# Patient Record
Sex: Female | Born: 1945 | Race: White | Hispanic: No | Marital: Single | Smoking: Never smoker
Health system: Southern US, Community
[De-identification: ages and names within clinical notes are randomized; demographics above are authoritative.]

## PROBLEM LIST (undated history)

## (undated) DIAGNOSIS — I10 Essential (primary) hypertension: Secondary | ICD-10-CM

---

## 2012-02-23 HISTORY — PX: CHOLECYSTECTOMY: SHX55

## 2012-02-23 HISTORY — PX: OTHER SURGICAL HISTORY: SHX169

## 2012-09-27 ENCOUNTER — Emergency Department: Payer: Self-pay | Admitting: Emergency Medicine

## 2012-09-27 LAB — URINALYSIS, COMPLETE
Glucose,UR: NEGATIVE mg/dL (ref 0–75)
Ketone: NEGATIVE
Nitrite: POSITIVE
Ph: 5 (ref 4.5–8.0)
Protein: NEGATIVE
RBC,UR: 11 /HPF (ref 0–5)
Specific Gravity: 1.015 (ref 1.003–1.030)
Squamous Epithelial: 17
Transitional Epi: 6
WBC UR: 59 /HPF (ref 0–5)

## 2012-09-27 LAB — COMPREHENSIVE METABOLIC PANEL
Albumin: 3.6 g/dL (ref 3.4–5.0)
Alkaline Phosphatase: 154 U/L — ABNORMAL HIGH (ref 50–136)
Anion Gap: 7 (ref 7–16)
BUN: 15 mg/dL (ref 7–18)
Calcium, Total: 9.2 mg/dL (ref 8.5–10.1)
Co2: 29 mmol/L (ref 21–32)
Creatinine: 1.05 mg/dL (ref 0.60–1.30)
EGFR (African American): >60
Glucose: 149 mg/dL — ABNORMAL HIGH (ref 65–99)
Osmolality: 272 (ref 275–301)
SGOT(AST): 306 U/L — ABNORMAL HIGH (ref 15–37)
SGPT (ALT): 303 U/L — ABNORMAL HIGH (ref 12–78)
Total Protein: 7.9 g/dL (ref 6.4–8.2)

## 2012-09-27 LAB — CBC
Platelet: 240 10*3/uL (ref 150–440)
RBC: 4.52 10*6/uL (ref 3.80–5.20)
WBC: 11.4 10*3/uL — ABNORMAL HIGH (ref 3.6–11.0)

## 2012-09-27 LAB — TROPONIN I: Troponin-I: <0.02 ng/mL

## 2012-09-28 ENCOUNTER — Inpatient Hospital Stay: Payer: Self-pay | Admitting: Surgery

## 2012-09-29 LAB — CBC WITH DIFFERENTIAL/PLATELET
Basophil #: 0 10*3/uL (ref 0.0–0.1)
Eosinophil %: 0 %
HCT: 29.7 % — ABNORMAL LOW (ref 35.0–47.0)
Lymphocyte #: 1.1 10*3/uL (ref 1.0–3.6)
MCH: 28.6 pg (ref 26.0–34.0)
Monocyte #: 0.9 x10 3/mm (ref 0.2–0.9)
Monocyte %: 8.2 %
Neutrophil %: 81.2 %
Platelet: 248 10*3/uL (ref 150–440)
RBC: 3.59 10*6/uL — ABNORMAL LOW (ref 3.80–5.20)
RDW: 13.8 % (ref 11.5–14.5)

## 2012-09-29 LAB — HEPATIC FUNCTION PANEL A (ARMC)
Albumin: 2.5 g/dL — ABNORMAL LOW (ref 3.4–5.0)
Alkaline Phosphatase: 132 U/L (ref 50–136)
Bilirubin, Direct: 0.2 mg/dL (ref 0.00–0.20)
Bilirubin,Total: 0.7 mg/dL (ref 0.2–1.0)
SGOT(AST): 155 U/L — ABNORMAL HIGH (ref 15–37)

## 2012-09-29 LAB — BASIC METABOLIC PANEL
Calcium, Total: 8 mg/dL — ABNORMAL LOW (ref 8.5–10.1)
Chloride: 101 mmol/L (ref 98–107)
Creatinine: 1.18 mg/dL (ref 0.60–1.30)
EGFR (African American): 56 — ABNORMAL LOW
Osmolality: 277 (ref 275–301)
Potassium: 4.6 mmol/L (ref 3.5–5.1)
Sodium: 135 mmol/L — ABNORMAL LOW (ref 136–145)

## 2012-09-30 LAB — BASIC METABOLIC PANEL
BUN: 17 mg/dL (ref 7–18)
Calcium, Total: 8.1 mg/dL — ABNORMAL LOW (ref 8.5–10.1)
Chloride: 103 mmol/L (ref 98–107)
Co2: 28 mmol/L (ref 21–32)
EGFR (African American): >60
EGFR (Non-African Amer.): >60
Glucose: 149 mg/dL — ABNORMAL HIGH (ref 65–99)
Osmolality: 273 (ref 275–301)
Potassium: 4.7 mmol/L (ref 3.5–5.1)
Sodium: 134 mmol/L — ABNORMAL LOW (ref 136–145)

## 2012-09-30 LAB — CBC WITH DIFFERENTIAL/PLATELET
Basophil %: 0.2 %
Eosinophil #: 0.1 10*3/uL (ref 0.0–0.7)
Lymphocyte %: 8 %
MCHC: 34.2 g/dL (ref 32.0–36.0)
Monocyte #: 0.5 x10 3/mm (ref 0.2–0.9)
Platelet: 276 10*3/uL (ref 150–440)
RDW: 13.9 % (ref 11.5–14.5)
WBC: 6.3 10*3/uL (ref 3.6–11.0)

## 2012-09-30 LAB — HEPATIC FUNCTION PANEL A (ARMC)
Albumin: 2.2 g/dL — ABNORMAL LOW (ref 3.4–5.0)
Alkaline Phosphatase: 122 U/L (ref 50–136)
SGOT(AST): 70 U/L — ABNORMAL HIGH (ref 15–37)
SGPT (ALT): 95 U/L — ABNORMAL HIGH (ref 12–78)

## 2012-10-01 LAB — HEPATIC FUNCTION PANEL A (ARMC)
Albumin: 2.2 g/dL — ABNORMAL LOW (ref 3.4–5.0)
Alkaline Phosphatase: 166 U/L — ABNORMAL HIGH (ref 50–136)
Bilirubin, Direct: 0.3 mg/dL — ABNORMAL HIGH (ref 0.00–0.20)
SGOT(AST): 48 U/L — ABNORMAL HIGH (ref 15–37)
SGPT (ALT): 71 U/L (ref 12–78)

## 2012-10-01 LAB — BASIC METABOLIC PANEL
Calcium, Total: 8.4 mg/dL — ABNORMAL LOW (ref 8.5–10.1)
Co2: 29 mmol/L (ref 21–32)
Creatinine: 0.79 mg/dL (ref 0.60–1.30)
EGFR (African American): >60
Osmolality: 273 (ref 275–301)

## 2012-10-01 LAB — CBC WITH DIFFERENTIAL/PLATELET
Basophil %: 0.1 %
Eosinophil #: 0.1 10*3/uL (ref 0.0–0.7)
HCT: 27.2 % — ABNORMAL LOW (ref 35.0–47.0)
HGB: 9.3 g/dL — ABNORMAL LOW (ref 12.0–16.0)
Lymphocyte #: 1.2 10*3/uL (ref 1.0–3.6)
Lymphocyte %: 17.1 %
MCHC: 34.3 g/dL (ref 32.0–36.0)
MCV: 83 fL (ref 80–100)
Monocyte #: 0.8 x10 3/mm (ref 0.2–0.9)
Monocyte %: 11.3 %
Neutrophil #: 4.8 10*3/uL (ref 1.4–6.5)
Neutrophil %: 69.4 %
Platelet: 337 10*3/uL (ref 150–440)
RBC: 3.26 10*6/uL — ABNORMAL LOW (ref 3.80–5.20)
RDW: 13.7 % (ref 11.5–14.5)
WBC: 6.9 10*3/uL (ref 3.6–11.0)

## 2012-10-02 LAB — CBC WITH DIFFERENTIAL/PLATELET
Basophil %: 0.3 %
Eosinophil %: 2.2 %
HCT: 27 % — ABNORMAL LOW (ref 35.0–47.0)
HGB: 9.5 g/dL — ABNORMAL LOW (ref 12.0–16.0)
Lymphocyte #: 1.6 10*3/uL (ref 1.0–3.6)
Lymphocyte %: 18.1 %
MCHC: 35.1 g/dL (ref 32.0–36.0)
MCV: 82 fL (ref 80–100)
Monocyte %: 10.6 %
Neutrophil #: 6.1 10*3/uL (ref 1.4–6.5)
Platelet: 433 10*3/uL (ref 150–440)
RBC: 3.31 10*6/uL — ABNORMAL LOW (ref 3.80–5.20)
WBC: 8.9 10*3/uL (ref 3.6–11.0)

## 2012-10-02 LAB — PATHOLOGY REPORT

## 2012-10-16 ENCOUNTER — Ambulatory Visit: Payer: Self-pay

## 2012-10-16 LAB — CBC WITH DIFFERENTIAL/PLATELET
Basophil #: 0.1 10*3/uL (ref 0.0–0.1)
Lymphocyte #: 1.5 10*3/uL (ref 1.0–3.6)
Lymphocyte %: 14.9 %
MCH: 26.8 pg (ref 26.0–34.0)
MCV: 81 fL (ref 80–100)
Neutrophil #: 7.5 10*3/uL — ABNORMAL HIGH (ref 1.4–6.5)
Neutrophil %: 76.3 %
Platelet: 553 10*3/uL — ABNORMAL HIGH (ref 150–440)
RBC: 3.75 10*6/uL — ABNORMAL LOW (ref 3.80–5.20)
RDW: 13.8 % (ref 11.5–14.5)
WBC: 9.8 10*3/uL (ref 3.6–11.0)

## 2012-10-16 LAB — COMPREHENSIVE METABOLIC PANEL
Albumin: 3.3 g/dL — ABNORMAL LOW (ref 3.4–5.0)
Alkaline Phosphatase: 241 U/L — ABNORMAL HIGH (ref 50–136)
BUN: 16 mg/dL (ref 7–18)
Bilirubin,Total: 0.3 mg/dL (ref 0.2–1.0)
Calcium, Total: 10 mg/dL (ref 8.5–10.1)
Chloride: 97 mmol/L — ABNORMAL LOW (ref 98–107)
EGFR (African American): >60
Glucose: 127 mg/dL — ABNORMAL HIGH (ref 65–99)
Osmolality: 278 (ref 275–301)
Potassium: 4.9 mmol/L (ref 3.5–5.1)
SGOT(AST): 42 U/L — ABNORMAL HIGH (ref 15–37)
SGPT (ALT): 51 U/L (ref 12–78)
Total Protein: 9.1 g/dL — ABNORMAL HIGH (ref 6.4–8.2)

## 2012-10-16 LAB — URINALYSIS, COMPLETE
Bilirubin,UR: NEGATIVE
Glucose,UR: NEGATIVE mg/dL (ref 0–75)
Ph: 7 (ref 4.5–8.0)
Specific Gravity: 1.01 (ref 1.003–1.030)

## 2012-10-16 LAB — LIPASE, BLOOD: Lipase: 613 U/L — ABNORMAL HIGH (ref 73–393)

## 2012-10-18 LAB — URINE CULTURE

## 2012-10-19 ENCOUNTER — Ambulatory Visit: Payer: Self-pay | Admitting: Surgery

## 2012-10-28 ENCOUNTER — Emergency Department: Payer: Self-pay | Admitting: Emergency Medicine

## 2012-10-28 LAB — COMPREHENSIVE METABOLIC PANEL
Albumin: 3.7 g/dL (ref 3.4–5.0)
Alkaline Phosphatase: 239 U/L — ABNORMAL HIGH (ref 50–136)
Anion Gap: 6 — ABNORMAL LOW (ref 7–16)
BUN: 9 mg/dL (ref 7–18)
Calcium, Total: 9.3 mg/dL (ref 8.5–10.1)
Chloride: 92 mmol/L — ABNORMAL LOW (ref 98–107)
Co2: 29 mmol/L (ref 21–32)
Creatinine: 0.81 mg/dL (ref 0.60–1.30)
EGFR (Non-African Amer.): >60
Osmolality: 257 (ref 275–301)
Potassium: 3.7 mmol/L (ref 3.5–5.1)
SGPT (ALT): 120 U/L — ABNORMAL HIGH (ref 12–78)
Sodium: 127 mmol/L — ABNORMAL LOW (ref 136–145)
Total Protein: 8.7 g/dL — ABNORMAL HIGH (ref 6.4–8.2)

## 2012-10-28 LAB — BASIC METABOLIC PANEL
Chloride: 101 mmol/L (ref 98–107)
Creatinine: 0.67 mg/dL (ref 0.60–1.30)
EGFR (African American): >60
EGFR (Non-African Amer.): >60
Glucose: 114 mg/dL — ABNORMAL HIGH (ref 65–99)
Osmolality: 267 (ref 275–301)
Sodium: 134 mmol/L — ABNORMAL LOW (ref 136–145)

## 2012-10-28 LAB — CBC
HCT: 32.9 % — ABNORMAL LOW (ref 35.0–47.0)
MCHC: 34.3 g/dL (ref 32.0–36.0)
Platelet: 400 10*3/uL (ref 150–440)
RDW: 14 % (ref 11.5–14.5)

## 2012-10-28 LAB — LIPASE, BLOOD: Lipase: 238 U/L (ref 73–393)

## 2012-10-28 LAB — CK TOTAL AND CKMB (NOT AT ARMC): CK-MB: <0.5 ng/mL — ABNORMAL LOW (ref 0.5–3.6)

## 2012-10-30 LAB — CBC WITH DIFFERENTIAL/PLATELET
Basophil #: 0.1 x10 3/mm 3
Basophil %: 1.1 %
Eosinophil #: 0 x10 3/mm 3
Eosinophil %: 0.7 %
HCT: 33 % — ABNORMAL LOW
HGB: 11.4 g/dL — ABNORMAL LOW
Lymphocyte %: 22.9 %
Lymphs Abs: 1.6 x10 3/mm 3
MCH: 27.5 pg
MCHC: 34.4 g/dL
MCV: 80 fL
Monocyte #: 0.6 "x10 3/mm "
Monocyte %: 8.6 %
Neutrophil #: 4.6 x10 3/mm 3
Neutrophil %: 66.7 %
Platelet: 430 x10 3/mm 3
RBC: 4.13 X10 6/mm 3
RDW: 14.4 %
WBC: 6.8 x10 3/mm 3

## 2012-10-30 LAB — COMPREHENSIVE METABOLIC PANEL WITH GFR
Albumin: 3.8 g/dL
Alkaline Phosphatase: 411 U/L — ABNORMAL HIGH
Anion Gap: 6 — ABNORMAL LOW
BUN: 7 mg/dL
Bilirubin,Total: 0.3 mg/dL
Calcium, Total: 9.6 mg/dL
Chloride: 97 mmol/L — ABNORMAL LOW
Co2: 29 mmol/L
Creatinine: 0.85 mg/dL
EGFR (African American): >60
EGFR (Non-African Amer.): >60
Glucose: 118 mg/dL — ABNORMAL HIGH
Osmolality: 264
Potassium: 4 mmol/L
SGOT(AST): 328 U/L — ABNORMAL HIGH
SGPT (ALT): 322 U/L — ABNORMAL HIGH
Sodium: 132 mmol/L — ABNORMAL LOW
Total Protein: 8.5 g/dL — ABNORMAL HIGH

## 2012-10-30 LAB — LIPASE, BLOOD: Lipase: 375 U/L

## 2012-10-31 ENCOUNTER — Inpatient Hospital Stay: Payer: Self-pay | Admitting: Surgery

## 2012-11-02 LAB — COMPREHENSIVE METABOLIC PANEL
Albumin: 3 g/dL — ABNORMAL LOW (ref 3.4–5.0)
BUN: 12 mg/dL (ref 7–18)
Glucose: 88 mg/dL (ref 65–99)
Osmolality: 277 (ref 275–301)
Potassium: 3.7 mmol/L (ref 3.5–5.1)
SGPT (ALT): 128 U/L — ABNORMAL HIGH (ref 12–78)

## 2012-11-22 ENCOUNTER — Emergency Department: Payer: Self-pay | Admitting: Emergency Medicine

## 2012-11-22 LAB — COMPREHENSIVE METABOLIC PANEL
Alkaline Phosphatase: 123 U/L (ref 50–136)
Chloride: 101 mmol/L (ref 98–107)
Creatinine: 0.78 mg/dL (ref 0.60–1.30)
EGFR (Non-African Amer.): >60
Glucose: 111 mg/dL — ABNORMAL HIGH (ref 65–99)
Osmolality: 273 (ref 275–301)
Potassium: 3.3 mmol/L — ABNORMAL LOW (ref 3.5–5.1)
SGOT(AST): 23 U/L (ref 15–37)
Sodium: 136 mmol/L (ref 136–145)
Total Protein: 7.9 g/dL (ref 6.4–8.2)

## 2012-11-22 LAB — CBC WITH DIFFERENTIAL/PLATELET
Basophil #: 0 10*3/uL (ref 0.0–0.1)
Lymphocyte %: 33 %
MCH: 27.6 pg (ref 26.0–34.0)
Monocyte %: 5.5 %
Neutrophil #: 3.9 10*3/uL (ref 1.4–6.5)
Neutrophil %: 59.7 %
Platelet: 348 10*3/uL (ref 150–440)
RBC: 3.99 10*6/uL (ref 3.80–5.20)
RDW: 14.2 % (ref 11.5–14.5)

## 2012-11-22 LAB — TROPONIN I: Troponin-I: <0.02 ng/mL

## 2014-06-14 NOTE — Consult Note (Signed)
Asked to see patient for poss ERCP. Fortunately, daughters present at bedside. Discussed ERCP with daughters/pt. Discussed potential risks, incl pancreatitis.. Min RUQ pain now. LFT high. Pt NPO today. Will try to get ERCP later this afternoon. Thanks.  Electronic Signatures: Lutricia Feilh, Floyce Bujak (MD)  (Signed on 10-Sep-14 13:23)  Authored  Last Updated: 10-Sep-14 13:23 by Lutricia Feilh, Allyiah Gartner (MD)

## 2014-06-14 NOTE — Discharge Summary (Signed)
PATIENT NAME:  Rebekah DandyBAFTIU, Athalee MR#:  [id number] DATE OF BIRTH:  Nov 14, 1945  DATE OF ADMISSION:  09/28/2012  DATE OF DISCHARGE:  10/03/2012  BRIEF HISTORY:  [name] is a 69 year old [place] woman, seen in the Emergency Room with severe biliary symptoms, with probable acute cholecystitis. The patient had been sick for several weeks, and symptoms had increased over the several days prior to admission. She was seen on August 6. We felt that she would be best served with surgical intervention, as she did appear to have acute cholecystitis. She has slightly elevated bilirubin 1.2, and white blood cell count 11,000. Ultrasound revealed multiple stones, thickened gallbladder wall, evidence of acute cholecystitis. We recommended that she be admitted, treated with antibiotics, prepared for surgery. She declined, but returned the following morning for semi-elective surgical intervention. She underwent laparoscopic cholecystectomy with conversion to open cholecystectomy because of the severity of her gallbladder disease. She did appear to have a perforated gallbladder with necrosis in the dome of the gallbladder. The procedure had to be converted to an open procedure. She did have a very large common bile duct, but no evidence by liver function studies of obstruction. We cannot do a cholangiogram through the completely occluded cystic duct. Drains were placed, and the patient recovered uneventfully. She did have some mild postop bacteremia, likely secondary to profound abdominal infection. She continued to improve over the next several days, discharged home on the 12th, to be followed in the office in 7 to 10 days' time. The drain was removed.   DISCHARGE MEDICATIONS:  Include hydrochlorothiazide 12.5 mg once a day, aspirin 81 mg once a day, Zofran 4 mg t.i.d. p.r.n., and Vicodin 325/5 mg every 4 hours p.r.n.   FOLLOW UP:  She is set up to be followed in the office for possible investigation of her dilated  common bile duct with ERCP. The patient and her family were in agreement.      ____________________________ Carmie Endalph L. Ely III, MD rle:mr D: 10/18/2012 14:48:59 ET T: 10/18/2012 19:35:34 ET JOB#: [id number]  cc: Quentin Orealph L. Ely III, MD, <Dictator> Quentin OreALPH L ELY MD ELECTRONICALLY SIGNED 10/21/2012 0:17

## 2014-06-14 NOTE — Op Note (Signed)
PATIENT NAME:  Rebekah Oliver, Rebekah Oliver MR#:  [id number] DATE OF BIRTH:  1945-07-22  DATE OF PROCEDURE:  09/28/2012  PREOPERATIVE DIAGNOSIS: Acute cholecystitis.   POSTOPERATIVE DIAGNOSIS: Acute cholecystitis.   SURGERY: Laparoscopic cholecystectomy, conversion to open cholecystectomy.   SURGEON: Carmie Endalph L. Ely III, M.D.   ASSISTANElaina Pattee: [place], [place] student, and [place] student.   ANESTHESIA: General.   OPERATIVE PROCEDURE: With the patient in the supine position after induction of appropriate general anesthesia, the patient's abdomen was prepped with ChloraPrep and draped with sterile towels. The patient was placed in the head down, feet up position. A small infraumbilical incision was made in the standard fashion, carried down bluntly through the subcutaneous tissue. A Veress needle was used to cannulate the peritoneal cavity. CO2 was insufflated to appropriate pressure measurements. When approximately 2.5 liters of CO2 was instilled, the Veress needle was withdrawn. An 11 mm [employer] port was inserted into the peritoneal cavity. Intraperitoneal position was confirmed. CO2 was reinsufflated. The patient was placed in head up, feet down position and rotated slightly to the left side. Subxiphoid transverse incision was made and an 11 mm port inserted under direct vision. Two lateral ports, 5 mm in size, were inserted under direct vision. The stomach was moderately distended, so an orogastric tube was placed to decompress the stomach. The stomach was stuck to the omentum which was stuck over the gallbladder. The dome of the gallbladder was necrotic, and a large amount of purulence was noted, suggesting a ruptured gallbladder. The omentum was taken down with tedious dissection to get to the gallbladder. Hemostasis was achieved with Bovie electrocautery. Multiple attempts were made to establish the anatomy, and it appeared that the gallbladder ended just below the duodenum with the common bile duct dilated  and extending up into the porta hepatis. Because the dissection was so tedious and I was concerned about the anatomy, I elected to proceed with an open procedure. The abdomen was desufflated, and a right subcostal incision was made in the standard fashion. The incision was carried down through the subcutaneous tissue with Bovie electrocautery. Anterior and posterior fascia was identified and opened individually. The rectus muscle was divided with the Bovie. Posterior sheath was opened into the peritoneum. The Omni-Tract retractor was utilized for exposure. The gallbladder was then taken down from the dome toward the body of the gallbladder and the duct. There did appear to be a significantly distended common bile duct. The cystic duct had a large ductal obstruction at the junction. The cystic artery and cystic duct were identified. There was some bleeding identified from the right hepatic artery which was controlled with the Bovie, and the main body of the right hepatic artery was not compromised. The gallbladder was eventually removed from the bed in the liver using a combination of blunt and Bovie dissection. The gallbladder was passed off the table. The cystic duct was still quite distended and had not been controlled significantly because of the large obstructing stone right at the junction of the common duct. The duct was opened down to the edge of the common duct and the stone extracted. The cholangiogram catheter could not be inserted through the multiple valves into the common bile duct. I elected not to proceed with a common duct exploration as she did not have a significantly elevated bilirubin. However, her common bile duct was approximately 15 mm in diameter. If her bilirubin goes up or her clinical course suggests it, we will move to ERCP. For that reason, the cystic  duct stump was oversewn with individual sutures of 3-0 silk. There was a small duct in the bed of the liver which appeared to be an  accessory duct which was leaking a tiny amount of bile. It was oversewn using 3-0 Vicryl. Avitene was placed in the bed of the liver. The area was copiously irrigated and no significant bleeding sites were encountered. A piece of Gelfoam was then brought to the table, moistened and placed in the bed of the liver. A 19-French Jackson-Pratt drain was inserted through a separate stab wound into the bed of the liver. The drain was secured with 3-0 nylon. The fascia was closed in 2 layers with running 0 Maxon in the posterior layer and interrupted figure-of-eight sutures in the anterior fascia. Skin was clipped. Sterile dressings were applied. The patient was returned to the recovery room, having tolerated the procedure well. Sponge, instrument and needle counts were correct x2 in the operating room.    ____________________________ Quentin Ore III, MD rle:gb D: 09/28/2012 16:33:27 ET T: 09/29/2012 00:19:44 ET JOB#: [id number]  cc: Quentin Ore III, MD, <Dictator> Quentin Ore MD ELECTRONICALLY SIGNED 09/29/2012 16:58

## 2014-06-14 NOTE — Consult Note (Signed)
Chief Complaint:  Subjective/Chief Complaint seen for ruq pain, feeling some better today, denies nausea, abd pin improved.   VITAL SIGNS/ANCILLARY NOTES: **Vital Signs.:   09-Sep-14 14:33  Vital Signs Type Routine  Temperature Temperature (F) 98.6  Celsius 37  Temperature Source oral  Pulse Pulse 82  Respirations Respirations 18  Systolic BP Systolic BP 143  Diastolic BP (mmHg) Diastolic BP (mmHg) 78  Mean BP 99  Pulse Ox % Pulse Ox % 94  Pulse Ox Activity Level  At rest  Oxygen Delivery Room Air/ 21 %   Brief Assessment:  Cardiac Regular   Respiratory clear BS   Gastrointestinal details normal Soft  Nondistended  Bowel sounds normal  No rebound tenderness  mild discomfort ruq   Radiology Results: MRI:    09-Sep-14 14:14, MRCP MR Cholangiogram  MRCP MR Cholangiogram   REASON FOR EXAM:    Bile duct enlargement  COMMENTS:       PROCEDURE: MR  - MR CHOLANGIOGRAM  - Oct 31 2012  2:14PM     RESULT: History: Bile duct enlargement.    Comparison Study: CT 10/28/2012.    Findings: Standard M.R.C.P. obtained. Liver normal. Spleen normal.   Pancreas normal. Signal abnormalities noted distal common bile duct   consistent with gallstones within the distal common bile duct. There is   mild distention of the common bile duct to approxi- 7.6 mm. The common   hepatic bile duct is dilated to 11.4 mm. There is moderate intra- hepatic   ductal dilatation. A stricture at the level of the  cystic duct/common     hepatic duct level cannot be excluded. This could be related to spasm.   Tumor would be less likely.    IMPRESSION:   1. Distal common bile duct stones.  2. Possible stricture versus spasm at the level of the cystic duct/common   hepatic duct level.        Verified By: Gwynn Burly, M.D., MD  Nuclear Med:    09-Sep-14 12:25, Hepatobiliary Image - Nuc Med  Hepatobiliary Image - Nuc Med   REASON FOR EXAM:    Bile leak, obstruction  COMMENTS:       PROCEDURE: NM   - NM HEPATOBILIARY IMAGE  - Oct 31 2012 12:25PM     RESULT: The patient is undergoing evaluation for possible bile leak or   obstruction of the biliary tree. The patient received 8.2 mCi of   technetium 33m labeled Choletec intravenously. Imaging was then performed   over a period of 70  minutes in the supine position.    There is adequate uptake of the radiopharmaceutical by the liver.   Activity is demonstrated within the common bile duct. The duct appears   dilated. The radiotracer does enter the bowel. No extraluminal   radiotracer is demonstrated.  IMPRESSION:   1. Findings do not suggest a bile duct leak.  2. There is mild dilation of the common bile duct.     Dictation Site: 2        Verified By: DAVID A. [place], M.D., MD   Assessment/Plan:  Assessment/Plan:  Assessment 1) ruq pain s/p OCCY following severe cholecystitis-hida negative for bile duct leaks.  MRCP consistant with choledocholithiasis.  -symptomatically improved/   Plan 1) recommend ERCP.  I will contact Dr Bluford Kaufmann tomorrow am for proceedure in PM.  If Dr Servando Snare is available tomorrow, would be happy to have him do case as well, but he may not be available  tomorrow. Continue current.   Electronic Signatures: Barnetta ChapelSkulskie, Martin (MD)  (Signed 09-Sep-14 16:57)  Authored: Chief Complaint, VITAL SIGNS/ANCILLARY NOTES, Brief Assessment, Radiology Results, Assessment/Plan   Last Updated: 09-Sep-14 16:57 by Barnetta ChapelSkulskie, Martin (MD)

## 2014-06-14 NOTE — Consult Note (Signed)
PATIENT NAME:  Rebekah Oliver, Rebekah Oliver MR#:  [id number] DATE OF BIRTH:  09-28-1945  DATE OF CONSULTATION:  09/27/2012  CONSULTING PHYSICIAN:  Rodena Goldmann III, MD PRIMARY CARE PHYSICIAN: [place]  CHIEF COMPLAINT: Abdominal pain, nausea and vomiting.   BRIEF HISTORY: Rebekah Oliver is a 69 year old [place] woman accompanied by her daughter with a month's history of nausea, vomiting, multiple episodes of upper abdominal discomfort radiating through to her back and shoulders. The pain worsened over the last 24 hours. Pain appears to be more significant with eating, and she has lost some weight over the last month. She has significant bloating and indigestion in addition. She came to the Emergency Room for further evaluation. She denies history of hepatitis, yellow jaundice, pancreatitis, peptic ulcer disease, previous diagnosis of gallbladder disease or diverticulitis. She has had no abdominal surgery. Her only hospitalization has been for 6 vaginal deliveries. She denies any cardiac disease, hypertension or diabetes.   MEDICATIONS: She takes no cardioactive or antihypertensive medications.   SOCIAL HISTORY: She does not smoke cigarettes. Lives with her daughter.   REVIEW OF SYSTEMS: Otherwise unremarkable. A 10-point review of systems was carried out. She has no significant problems.   PHYSICAL EXAMINATION: GENERAL: She is an alert pleasant woman. The interview is carried out through the patient's daughter, who does speak [place].  VITALS: Blood pressure 160/77. She is afebrile at 98.3. Heart rate is 90 and regular. Respiratory rate is 18 and regular.  HEENT: No scleral icterus. No pupillary abnormalities. No facial deformities.  NECK: Supple without adenopathy. Midline trachea. No tenderness.  CHEST: Clear with normal pulmonary excursion. No adventitious sounds.  CARDIAC: No murmurs or gallops.  She seems to be in normal sinus rhythm.  ABDOMEN: Her abdomen is generally soft with some mild mid  epigastric discomfort but really no point tenderness, no rebound, no guarding, no masses.  BACK: She has no significant back pain.   EXTREMITIES: Lower extremity exam reveals full range of motion, no deformities and good distal pulses.  PSYCHIATRIC: Exam was normal orientation, normal affect.   LABORATORY AND DIAGNOSTIC DATA:  Laboratory values reveal a white blood cell count of 11,400. Liver functions studies were elevated with a bilirubin of 1.2, an alk phos of 154, elevated transaminases at over 300. Lipase is normal.  Ultrasound reveals multiple stones, mildly thickened gallbladder wall, some signs consistent with acute cholecystitis.   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 I spoke with her about the possibility of surgical intervention. We had planned admission, antibiotic therapy, rehydration and surgery.  At the present time, she is a very concerned about her grandchildren and wants to go home and return for followup and possible surgery tomorrow. At the present time, we are setting her up for surgical intervention tomorrow with the idea of pursuing a laparoscopic cholecystectomy with cholangiography. This plan has been discussed with the patient and her daughter in detail, and they are in agreement. Currently, she is to return to the hospital tomorrow morning for surgical intervention.   ____________________________ Micheline Maze, MD rle:cb D: 09/27/2012 15:19:25 ET T: 09/27/2012 16:36:18 ET JOB#: [id number]  cc: Micheline Maze, MD, <Dictator> [place] MD ELECTRONICALLY SIGNED 09/29/2012 16:57

## 2014-06-14 NOTE — Discharge Summary (Signed)
PATIENT NAME:  Rebekah DandyBAFTIU, Angelise MR#:  [id number] DATE OF BIRTH:  07/19/1945  DATE OF ADMISSION:  10/31/2012 DATE OF DISCHARGE:  11/03/2012  FINAL DIAGNOSIS: Choledocholithiasis, status post open cholecystectomy.   HOSPITAL COURSE SUMMARY:  The patient was admitted with abdominal pain approximately one month status post open cholecystectomy by Dr. Michela PitcherEly on September the 8th. She was found to have white count of 6.8 and fever. A  HIDA scan demonstrates no evidence of bile leak. An M.R.C.P. was performed, demonstrating common bile duct stone seen distally. ERCP was subsequently performed by Dr. Bluford Kaufmannh on the 10th of September, at which point, stones were extracted with biliary sphincterotomy and balloon extraction. Postoperatively, the patient did well. She was advanced to a clear liquid diet. On postprocedural day #1, the patient was doing well, tolerating liquids. On postoperative day #2, from the ERCP, she was stable, tolerating a regular diet with no abdominal pain.    DISCHARGE MEDICATIONS: Hydrochlorothiazide 12.5 mg by mouth once a day, aspirin 81 mg by mouth once a day, Zofran 4 mg by mouth 3 times a day as needed for nausea, clindamycin 300 mg by mouth 1 cap orally every 8 hours for 5 days, Keflex 500 mg by mouth every 6 hours x 5 days and Norco 5/325 1 tab every 6 to 8 hours as needed for pain.  DISCHARGE ACTIVITY:  As tolerated.  DISCHARGE FOLLOWUP:  Follow up in our office in 2 weeks' time. Call with any questions or concerns.     ____________________________ Redge GainerMark A. Egbert GaribaldiBird, MD mab:nts D: 11/04/2012 17:45:15 ET T: 11/05/2012 01:33:07 ET JOB#: [id number]  cc: Loraine LericheMark A. Egbert GaribaldiBird, MD, <Dictator> Ezzard StandingPaul Y. Bluford Kaufmannh, MD Christena DeemMartin U. Skulskie, MD Shakthi Scipio Kela MillinA Jesiah Grismer MD ELECTRONICALLY SIGNED 11/05/2012 17:52

## 2014-06-14 NOTE — Consult Note (Signed)
Distal CBD stone present during ERCP. Biliary sphincterotomy and stone extracted through balloon sweep. Recheck LFT in AM. Clear liquid diet rest of today. Hold tomorrow's heparin dose. Thanks  Electronic Signatures: Lutricia Feilh, Mazikeen Hehn (MD)  (Signed on 10-Sep-14 16:18)  Authored  Last Updated: 10-Sep-14 16:18 by Lutricia Feilh, Nakeyia Menden (MD)

## 2014-06-14 NOTE — Consult Note (Signed)
Brief Consult Note: Diagnosis: right upper quadrant pain.   Patient was seen by consultant.   Recommend further assessment or treatment.   Discussed with Attending MD.   Comments: Please see full GI consult to follow.  Patient seen and examined, chart reviewed.  Patient presenting with increasing ruq abdominal pain over the past 2 weeks of a burning nature.  Patient had OCCY 09/28/12 for apparent perforated gallbladder.  Pre surgical pain better after surgery, but then began to have ruq burning since then , increasing as above.  Mild nausea no emesis.  poor appertite.  CBD dilated, but less so than on previous ct several weeks ago.  Concern for late/low grade bile duct leak, with symptomatic mild to moderate bile peritonitis.  Recommend hepatobiliary scan initially and if negative the MRCP.  If the HIDA is postive for duct leak, ERCP.  Discussed with Dr Michela PitcherEly.  Electronic Signatures for Addendum Section:  Barnetta ChapelSkulskie, Adalynn Corne (MD) (Signed Addendum 08-Sep-14 20:53)  Please see full GI consult [id number].   Electronic Signatures: Barnetta ChapelSkulskie, Ramzi Brathwaite (MD)  (Signed 08-Sep-14 20:38)  Authored: Brief Consult Note   Last Updated: 08-Sep-14 20:53 by Barnetta ChapelSkulskie, Alphonso Gregson (MD)

## 2014-06-14 NOTE — Consult Note (Signed)
Chief Complaint:  Subjective/Chief Complaint s/p ercp for choledocholithiasis s/p occy. doing well tolerating clears, denies abdominal pain or nausea.   VITAL SIGNS/ANCILLARY NOTES: **Vital Signs.:   11-Sep-14 14:19  Vital Signs Type Routine  Temperature Temperature (F) 98.4  Celsius 36.8  Temperature Source oral  Pulse Pulse 70  Respirations Respirations 18  Systolic BP Systolic BP 505  Diastolic BP (mmHg) Diastolic BP (mmHg) 75  Mean BP 88  Pulse Ox % Pulse Ox % 96  Pulse Ox Activity Level  At rest  Oxygen Delivery Room Air/ 21 %   Brief Assessment:  Cardiac Regular   Respiratory clear BS   Gastrointestinal details normal Soft  Nontender  Nondistended  No masses palpable  Bowel sounds normal   Lab Results: Hepatic:  08-Sep-14 15:00   Bilirubin, Total 0.3  Alkaline Phosphatase  411  SGPT (ALT)  322  SGOT (AST)  328  Total Protein, Serum  8.5  Albumin, Serum 3.8  11-Sep-14 04:58   Bilirubin, Total 0.8  Alkaline Phosphatase  275  SGPT (ALT)  128  Total Protein, Serum 6.8  Albumin, Serum  3.0  Routine Chem:  08-Sep-14 15:00   Glucose, Serum  118  BUN 7  Creatinine (comp) 0.85  Sodium, Serum  132  Potassium, Serum 4.0  Chloride, Serum  97  CO2, Serum 29  Calcium (Total), Serum 9.6  Osmolality (calc) 264  eGFR (African American) >60  eGFR (Non-African American) >60 (eGFR values <82m/min/1.73 m2 may be an indication of chronic kidney disease (CKD). Calculated eGFR is useful in patients with stable renal function. The eGFR calculation will not be reliable in acutely ill patients when serum creatinine is changing rapidly. It is not useful in  patients on dialysis. The eGFR calculation may not be applicable to patients at the low and high extremes of body sizes, pregnant women, and vegetarians.)  Anion Gap  6  Lipase 375 (Result(s) reported on 30 Oct 2012 at 03:33PM.)  11-Sep-14 04:58   Glucose, Serum 88  BUN 12  Creatinine (comp) 1.02  Sodium, Serum 139   Potassium, Serum 3.7  Chloride, Serum 105  CO2, Serum 27  Calcium (Total), Serum 8.9  Osmolality (calc) 277  eGFR (African American) >60  eGFR (Non-African American)  57 (eGFR values <657mmin/1.73 m2 may be an indication of chronic kidney disease (CKD). Calculated eGFR is useful in patients with stable renal function. The eGFR calculation will not be reliable in acutely ill patients when serum creatinine is changing rapidly. It is not useful in  patients on dialysis. The eGFR calculation may not be applicable to patients at the low and high extremes of body sizes, pregnant women, and vegetarians.)  Anion Gap 7  Routine Hem:  08-Sep-14 15:00   WBC (CBC) 6.8  RBC (CBC) 4.13  Hemoglobin (CBC)  11.4  Hematocrit (CBC)  33.0  Platelet Count (CBC) 430  MCV 80  MCH 27.5  MCHC 34.4  RDW 14.4  Neutrophil % 66.7  Lymphocyte % 22.9  Monocyte % 8.6  Eosinophil % 0.7  Basophil % 1.1  Neutrophil # 4.6  Lymphocyte # 1.6  Monocyte # 0.6  Eosinophil # 0.0  Basophil # 0.1 (Result(s) reported on 30 Oct 2012 at 03:23PM.)   Assessment/Plan:  Assessment/Plan:  Assessment 1) choledocholithiasis s/p ercp, doing well, tolerating clears. will sign off reconsult as needed.   Electronic Signatures: SkLoistine SimasMD)  (Signed 11-Sep-14 17:47)  Authored: Chief Complaint, VITAL SIGNS/ANCILLARY NOTES, Brief Assessment, Lab Results, Assessment/Plan   Last  Updated: 11-Sep-14 17:47 by Loistine Simas (MD)

## 2014-06-14 NOTE — Consult Note (Signed)
PATIENT NAME:  Rebekah Oliver, Rebekah Oliver MR#:  [id number] DATE OF BIRTH:  10-Jun-1945  DATE OF CONSULTATION:  10/30/2012  REFERRING PHYSICIAN:  Dr. Pat Patrick. CONSULTING PHYSICIAN:  Lollie Sails, MD  REASON FOR CONSULTATION: Enlarged bile duct status post cholecystectomy.   HISTORY OF PRESENT ILLNESS: [name] is a 69 year old [place] lady who was admitted to the hospital earlier today by her general surgeon in regard to abdominal pain and some nausea. I attempted to use the translation line; however, the dialect spoken by this patient was not recognized by the translator on that line. Fortunately she had several of her daughters present, one of whom spoke [place] well, and we were able to use this for translation given the lack of other sources.   The patient was admitted with a right upper quadrant pain that has been burning in nature. It does decrease with the use of hydrocodone. It has been increasing over the period of the past 2 weeks with associated nausea without vomiting. It will radiate sometimes around toward the right side more so. The patient underwent a cholecystectomy on 09/28/2012 due to cholecystitis and cholelithiasis. Apparently the patient had been advised to come in to have the procedure and decided to wait for an extra day.   Subsequent procedure was attempted initially laparoscopically then converted to open procedure due to the advanced state of inflammation and infection of the gallbladder. There was likely a perforation of the gallbladder that had occurred. She states that the pain that she had prior to the gallbladder procedure had resolved or gotten better, however, had been replaced with a low-grade burning pain after the procedure.   Over the period of the next several weeks, this pain continued and then increased over the period of the past 2 weeks to where she sought reassessment and was admitted to the hospital by her surgeon.   She did have a CT scan today that showed a gas  density in the gallbladder fossa as well as a large amount of stool in the colon. There was a mild increase of size of the common bile duct; however, in comparison with previous CT scan as well as previous reported ultrasound, this was actually smaller than noted before.   Currently she is hemodynamically stable. She is afebrile.   PAST MEDICAL HISTORY: History of hypertension. There is no apparent other surgical history. She has at times taken medication for reflux, however is not currently taking this at home.   GENITOURINARY FAMILY HISTORY: Not obtainable currently.   REVIEW OF SYSTEMS: The patient had had pain for at least several months prior to her surgery, which improved after the surgery, however now with a different presenting pain.   ALLERGIES: No known drug allergies.   OUTPATIENT MEDICATIONS: Include acetaminophen/hydrocodone 325/5 mg one every six  to 8 hours p.r.n., aspirin 81 mg daily, cephalexin 500 mg one capsule 4 times a day, clindamycin 300 mg every 8 hours, hydrochlorothiazide 12.5 mg once a day, and ondansetron 4 mg three  times a day p.r.n. for nausea or vomiting.   PHYSICAL EXAMINATION:  VITAL SIGNS: Temperature 98.5, pulse 88, respirations 19, blood pressure 128/79, pulse oximetry 97%.  GENERAL: An otherwise well-appearing 69 year old female, no acute distress.  HEENT: Normocephalic, atraumatic.  EYES: Anicteric.  NOSE: Septum midline. No lesions.  OROPHARYNX: No lesions.  NECK: Supple. No JVD.  HEART: Regular rate and rhythm.  LUNGS: Clear.  ABDOMEN: Soft. Markedly tender to palpation in the right upper quadrant. She does have a well-healed surgical  incision in this area. There is no rebound. There are no masses noted although palpation was light. Bowel sounds are positive.  EXTREMITIES: No clubbing, cyanosis, or edema.  NEUROLOGICAL: Cranial nerves II through XII grossly intact. Muscle strength bilaterally equal and symmetric. DTRs bilaterally equal and symmetric.    LABORATORY, DIAGNOSTIC, AND RADIOLOGICAL TODAY: Includes: She had a glucose of 118. BUN 7, creatinine 0.85, sodium 132, potassium 4.0, chloride 97, bicarb 29, calcium 9.6, lipase 375. This on a scale of 73 to 393. This does represent an increase from 238 measured via lab 2 days ago.   Also, 2 days ago, she had a hepatic profile showing a total protein of 8.7, albumin 3.7, total bilirubin 0.3, alk phos 239, AST 65, ALT 120. Repeat hepatic profile today showed the total protein 8.5. Her total bilirubin again, 0.3. Alk phos increased at 411, AST increased at 328, ALT increased at 322.   Her hemogram shows a white count today of 6.8, H and H 11.4/33.80, platelet count 430. MCV 80.   IMAGING: As noted above. She also had a PA and lateral chest film showing no acute cardiopulmonary disease.   ASSESSMENT: Recurrent right upper quadrant pain status post open cholecystectomy. The gallbladder was quite inflamed, likely perforated on removal. On repeat CT scan, her common bile duct is actually somewhat smaller than previous; however, it is still is slightly dilated and there is a fluid level/air pocket in the gallbladder fossa. My concern is for a bile duct leak, either from the bed of the gallbladder, duct of Luschka, or the cystic stump itself. I feel that it is more likely one of the first two since this has been a slow build-up to this point with discomfort.   RECOMMENDATION:  1.  Continue a proton pump inhibitor. The 40 mg oral tablet daily twice a day will be good. Would continue IV antibiotics as you are.  2.  Recommend hepatobiliary study to evaluate for possible bile duct leak. If this is showing a bile duct leak, would proceed to ERCP for further evaluation and possible stent placement depending on the findings at that time. If, however, there is no evidence of bile duct leak on the hepatobiliary study, would proceed with MRCP.  3.  We will follow with you. Thank you for this consult.     ____________________________ Lollie Sails, MD mus:np D: 10/30/2012 20:52:34 ET T: 10/30/2012 21:27:52 ET JOB#: [id number]  cc: Lollie Sails, MD, <Dictator> Micheline Maze, MD Lollie Sails MD ELECTRONICALLY SIGNED 11/02/2012 20:24

## 2015-02-03 ENCOUNTER — Encounter: Payer: Self-pay | Admitting: Internal Medicine

## 2015-02-03 DIAGNOSIS — I10 Essential (primary) hypertension: Secondary | ICD-10-CM | POA: Insufficient documentation

## 2015-05-03 IMAGING — CR DG CHEST 2V
1 series · 2 of 2 positions shown · non-contrast
Comparison: none

REASON FOR EXAM: burning in chest
COMMENTS:

[Series 1: w chest pa · 0.14mm/px · 2 of 2 slices shown]
[im 1/2]
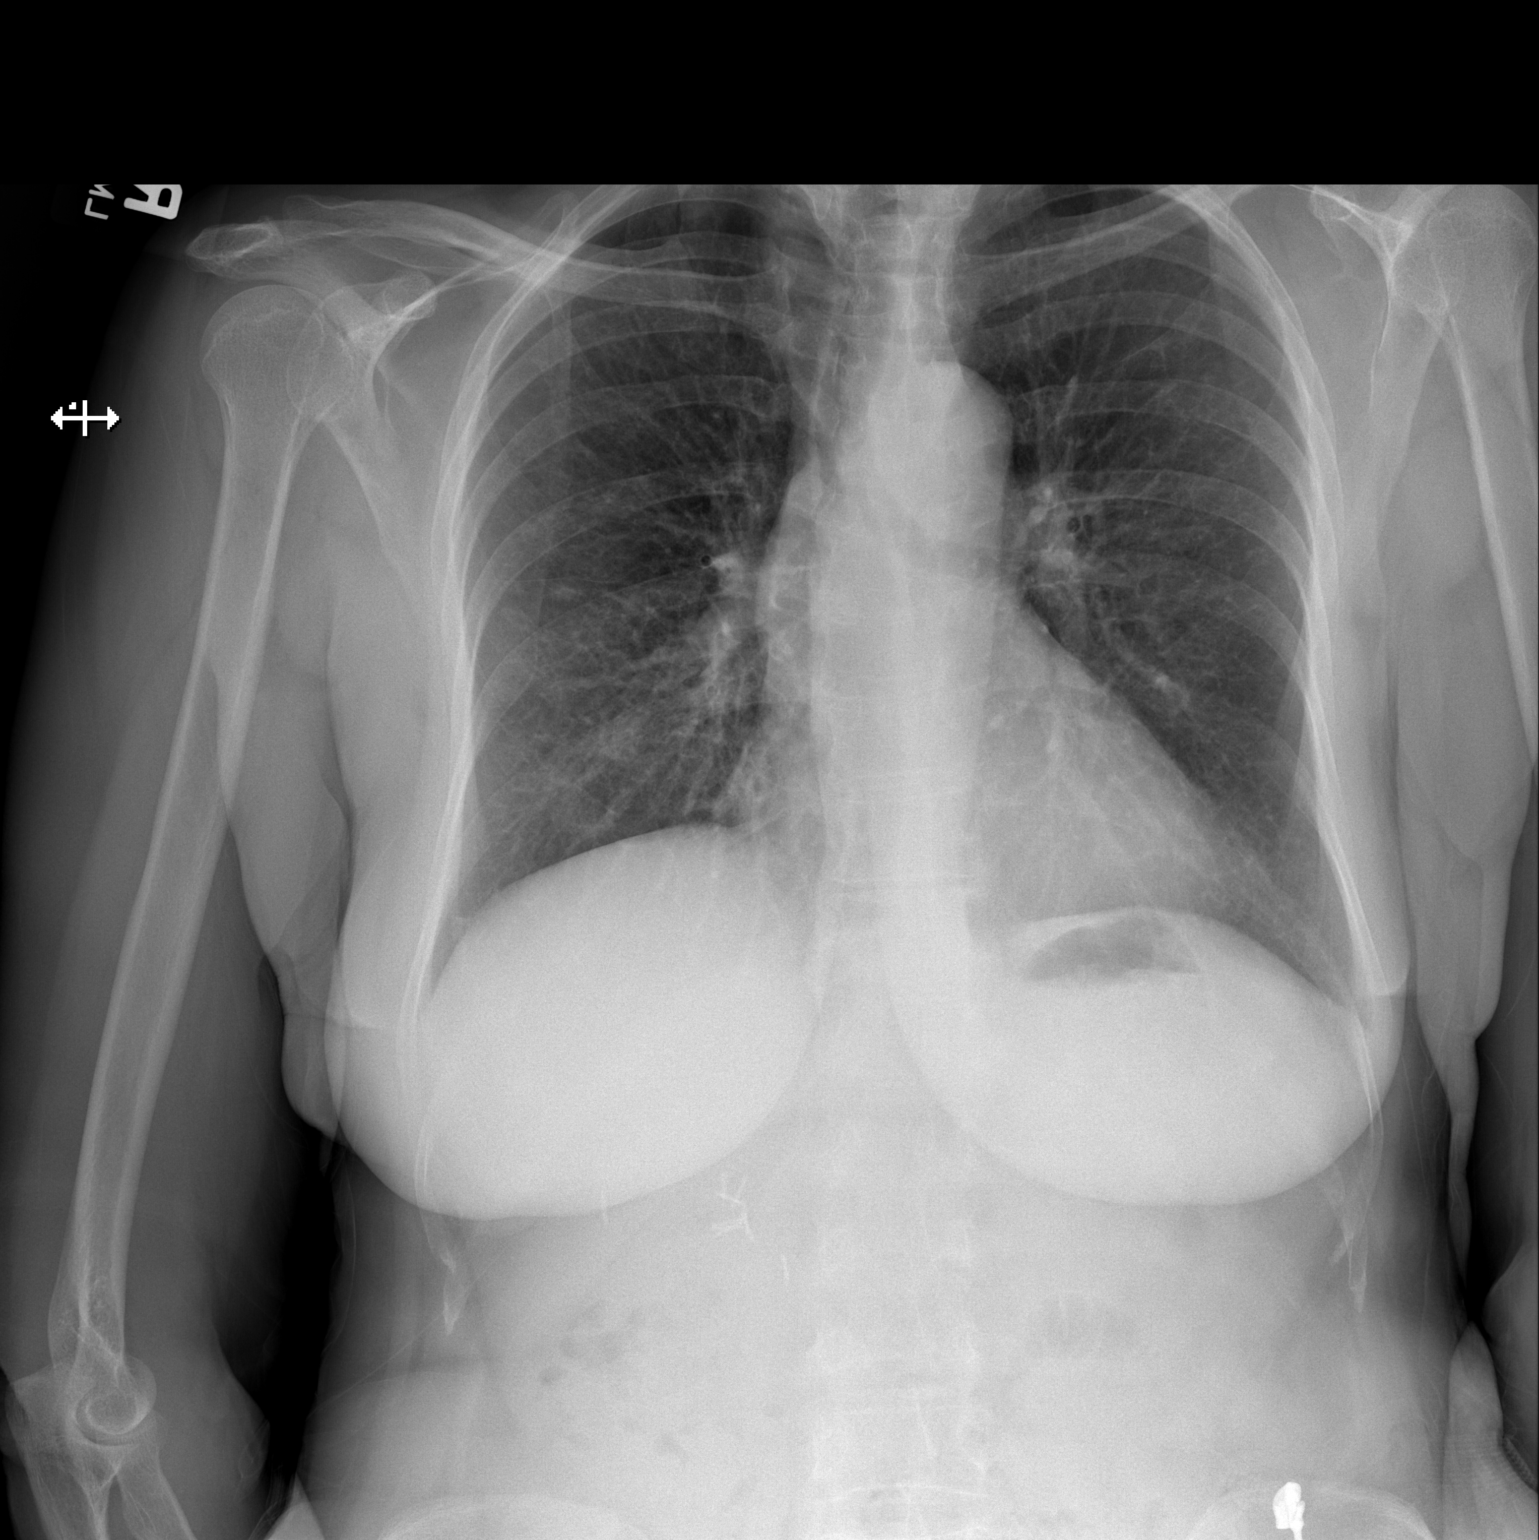
[im 2/2]
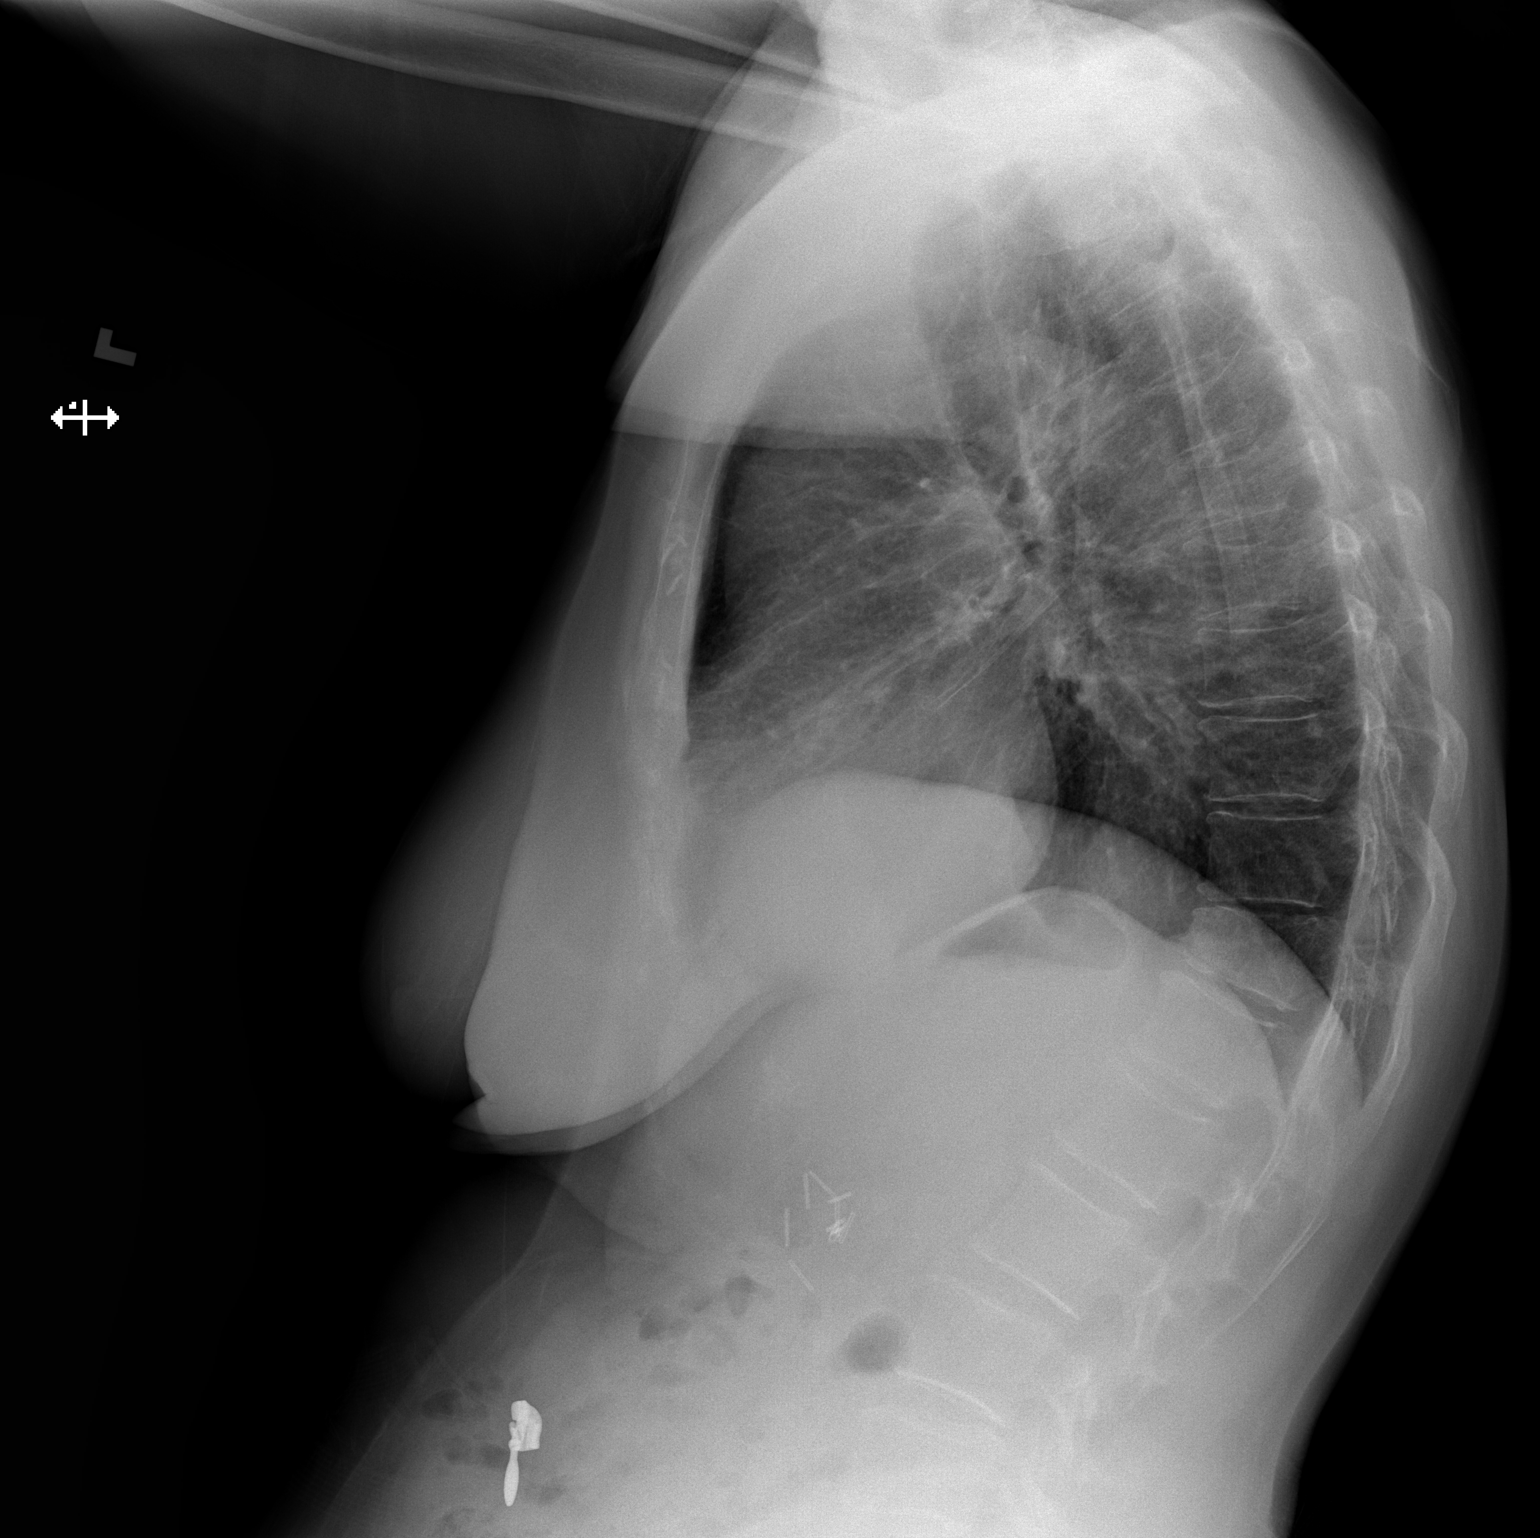

[2 of 2 positions shown; findings below may reference images not displayed]

PROCEDURE:     DXR - DXR CHEST PA (OR AP) AND LATERAL  - October 28, 2012  [DATE]

RESULT:     Comparison is made to the previous study of 08/01/2012.

The lungs are clear. The heart and pulmonary vessels are normal. The bony
and mediastinal structures are unremarkable. There is no effusion. There is
no pneumothorax or evidence of congestive failure.
IMPRESSION: No acute cardiopulmonary disease. Stable appearance.

[REDACTED]

## 2015-05-03 IMAGING — CT CT ABD-PELV W/ CM
1 of 4 series · 10 of 32 positions shown, 16 images · non-contrast
Comparison: none

REASON FOR EXAM: (1) RUQ, post surgical pain eval, elevated LFTs; (2)
RUQ, post surgical pain Luna
COMMENTS:

[Series 2: [place] · axial · 0.63mm/px · z∈[-792,-450]mm · 10 of 280 slices shown, 16 images]
[im 26/280  soft-tissue]
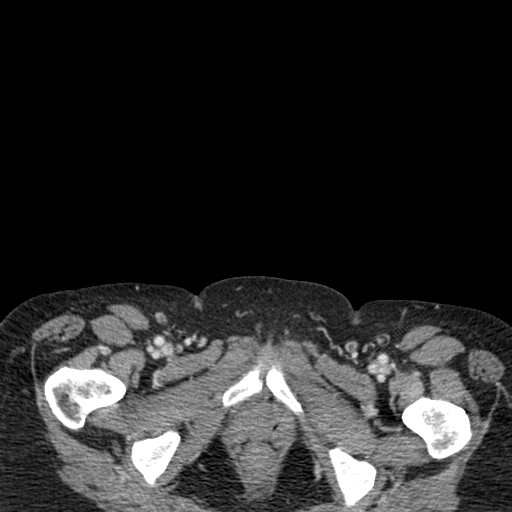
[im 26/280  bone]
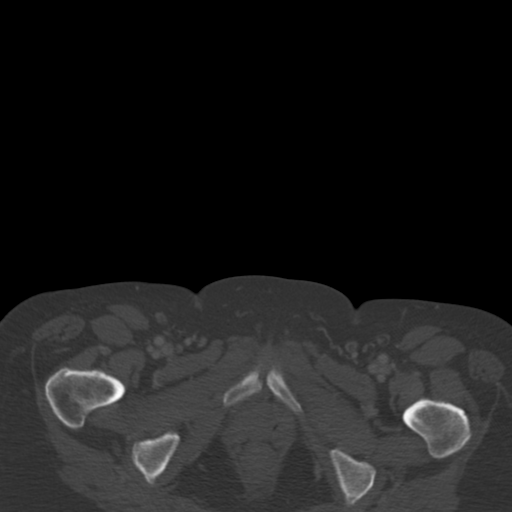
[im 51/280  soft-tissue]
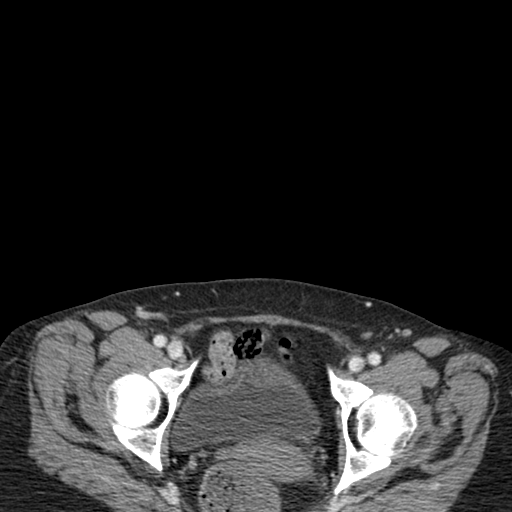
[im 77/280  soft-tissue]
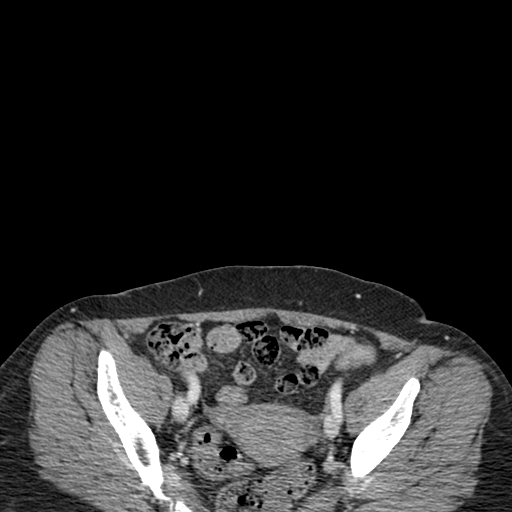
[im 102/280  soft-tissue]
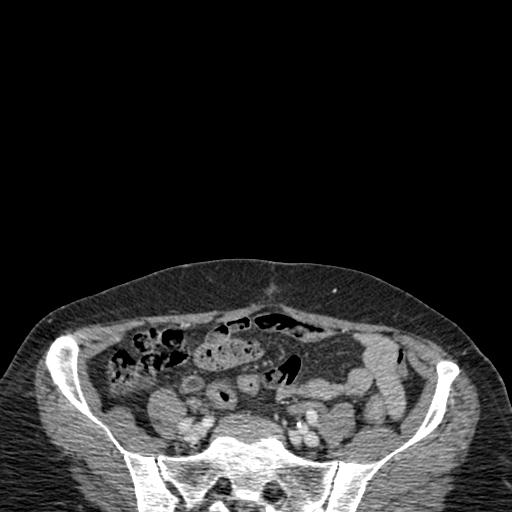
[im 127/280  soft-tissue]
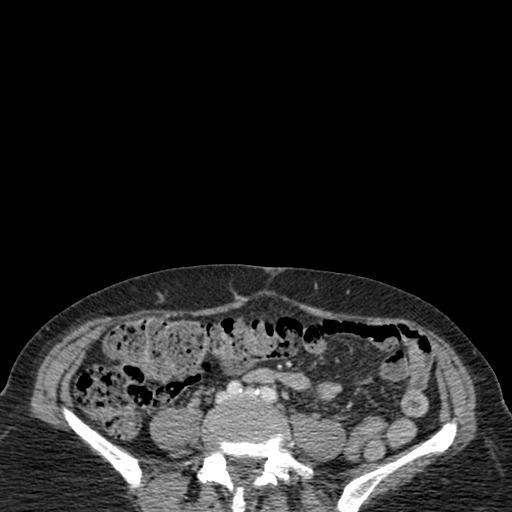
[im 153/280  soft-tissue]
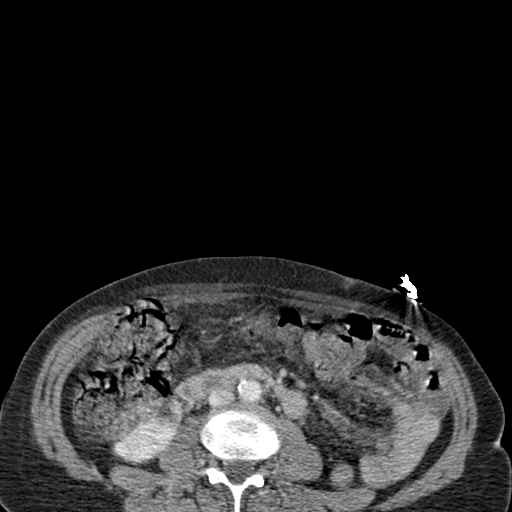
[im 178/280  soft-tissue]
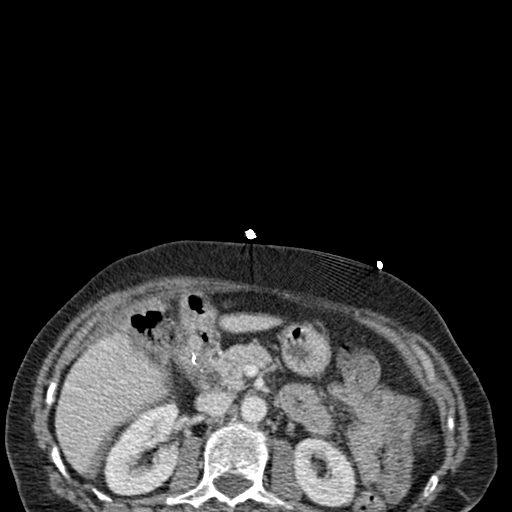
[im 178/280  lung]
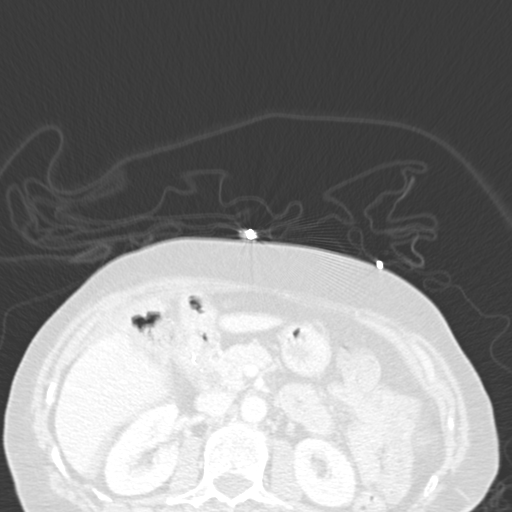
[im 203/280  soft-tissue]
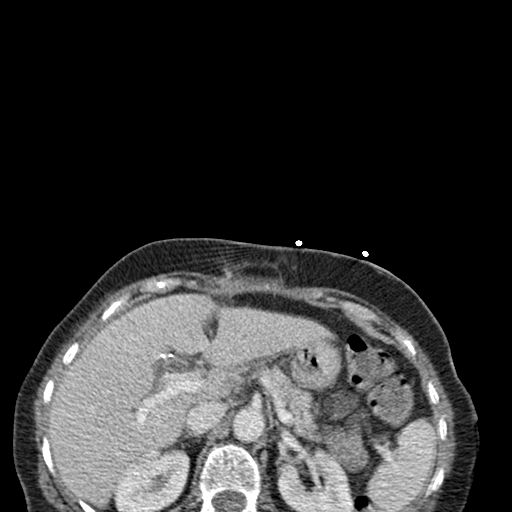
[im 203/280  lung]
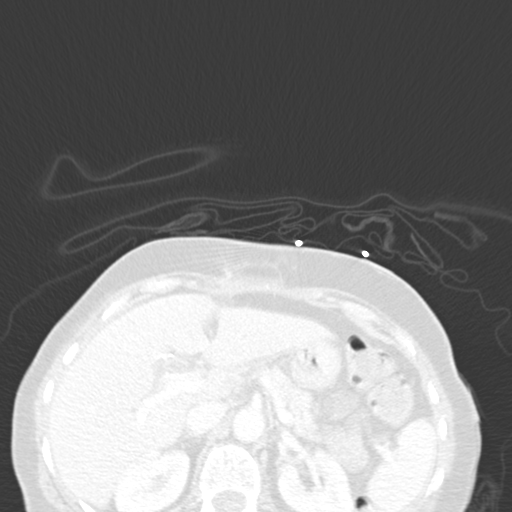
[im 229/280  soft-tissue]
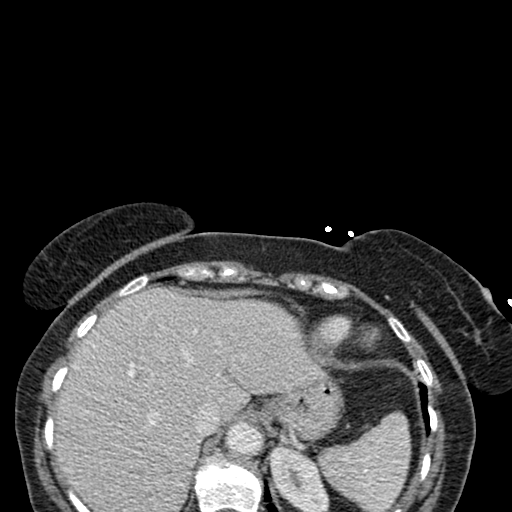
[im 229/280  lung]
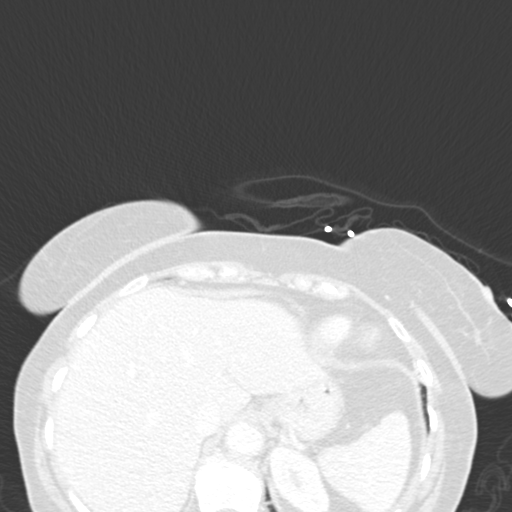
[im 229/280  bone]
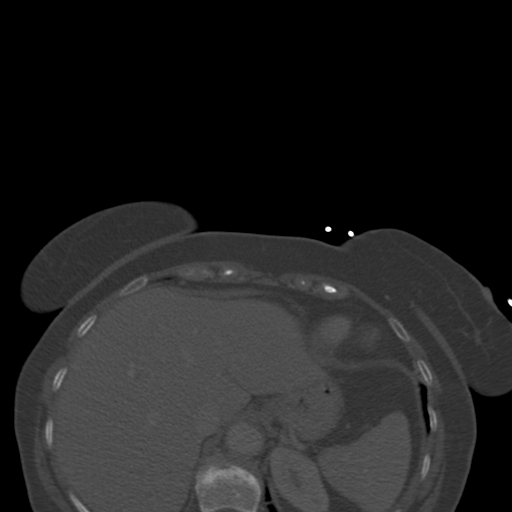
[im 254/280  soft-tissue]
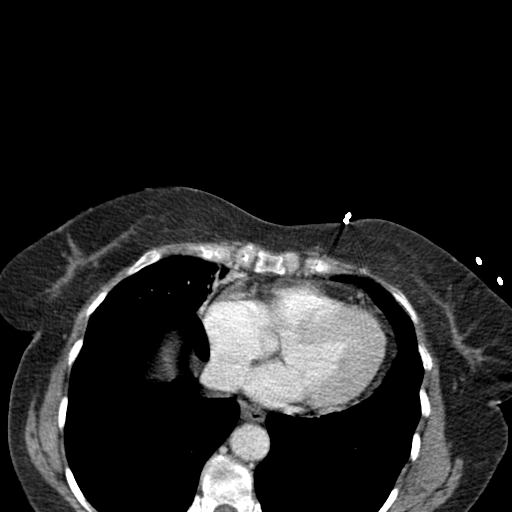
[im 254/280  lung]
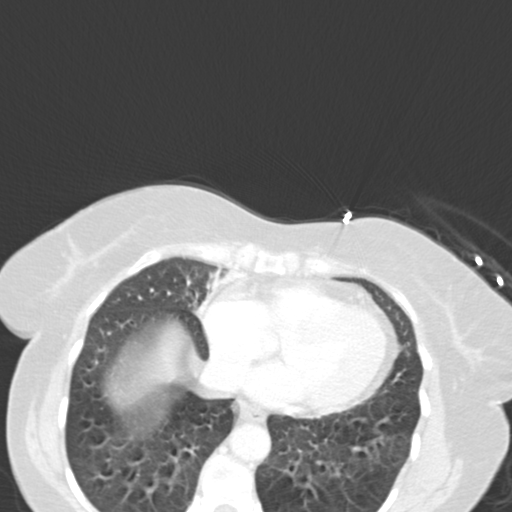

[10 of 32 positions shown; findings below may reference images not displayed]

PROCEDURE:     CT  - CT ABDOMEN / PELVIS  W  - October 28, 2012  [DATE]

RESULT:     CT of the abdomen and pelvis is performed utilizing 100 mL of
Wsovue-QTN iodinated intravenous contrast without the benefit of oral
contrast. Images are reconstructed at 3.0 mm slice thickness in the axial
plane. Comparison is made images dated 10/19/2012.

There is motion artifact degrading the study. There is hazy increased
density in the lung bases which could be secondary to artifact or
atelectasis. No consolidation is present. Numerous surgical clips are
present presumably related to the previous cholecystectomy. One clip is next
to the distended common bile duct in the area of image 50 and 51 as well as
52. This could represent more than one clip. There is persistent gaseous
density in the gallbladder fossa with irregular margins. This appears to be
slightly smaller than on the previous study measuring up to 2.68 cm. There
does not appear to be significant intrahepatic biliary ductal dilation. The
liver enhances homogeneously. The spleen appears unremarkable. The distal
esophagus is within normal limits. The kidneys enhance homogeneously. The
adrenal glands are unremarkable. The abdominal aorta is normal. There is a
moderately large amount of fecal material in the transverse colon and
ascending colon as well as in the rectum and rectosigmoid region. The
urinary bladder, uterus and adnexal structures appear unremarkable. The
appendix remains distended without evidence of air within the lumen of the
appendix. The wall appears to enhance mildly as seen previously. There is no
definite adjacent inflammatory stranding. Scattered atherosclerotic
calcification is noted in the aorta. No ascites is demonstrated. The bony
structures appear intact.
IMPRESSION: 1. Essentially stable CT of the abdomen and pelvis. Cholecystectomy clips
are present. There is a gaseous density collection in the gallbladder fossa
region which is slightly smaller than the one seen previously.
2. There is mild prominence of the common bile duct.
3. Mild distention of the appendix is unchanged.
4. There is a moderately large amount of fecal material present in the
colon. Correlate for constipation.

[REDACTED]

## 2017-06-24 ENCOUNTER — Emergency Department: Payer: Medicare Other

## 2017-06-24 ENCOUNTER — Emergency Department
Admission: EM | Admit: 2017-06-24 | Discharge: 2017-06-24 | Disposition: A | Discharge status: Home / Self Care | Payer: Medicare Other | Attending: Emergency Medicine | Admitting: Emergency Medicine

## 2017-06-24 ENCOUNTER — Encounter: Payer: Self-pay | Admitting: Emergency Medicine

## 2017-06-24 DIAGNOSIS — I10 Essential (primary) hypertension: Secondary | ICD-10-CM | POA: Insufficient documentation

## 2017-06-24 DIAGNOSIS — R509 Fever, unspecified: Secondary | ICD-10-CM | POA: Diagnosis present

## 2017-06-24 DIAGNOSIS — H93A2 Pulsatile tinnitus, left ear: Secondary | ICD-10-CM | POA: Diagnosis not present

## 2017-06-24 DIAGNOSIS — H93A9 Pulsatile tinnitus, unspecified ear: Secondary | ICD-10-CM

## 2017-06-24 LAB — COMPREHENSIVE METABOLIC PANEL
ALK PHOS: 70 U/L (ref 38–126)
ALT: 14 U/L (ref 14–54)
ANION GAP: 8 (ref 5–15)
AST: 24 U/L (ref 15–41)
Albumin: 4.2 g/dL (ref 3.5–5.0)
BILIRUBIN TOTAL: 0.7 mg/dL (ref 0.3–1.2)
BUN: 27 mg/dL — ABNORMAL HIGH (ref 6–20)
CALCIUM: 8.9 mg/dL (ref 8.9–10.3)
CO2: 24 mmol/L (ref 22–32)
Chloride: 107 mmol/L (ref 101–111)
Creatinine, Ser: 0.79 mg/dL (ref 0.44–1.00)
Glucose, Bld: 104 mg/dL — ABNORMAL HIGH (ref 65–99)
Potassium: 4.2 mmol/L (ref 3.5–5.1)
SODIUM: 139 mmol/L (ref 135–145)
TOTAL PROTEIN: 7.6 g/dL (ref 6.5–8.1)

## 2017-06-24 LAB — CBC WITH DIFFERENTIAL/PLATELET
BASOS ABS: 0 10*3/uL (ref 0–0.1)
BASOS PCT: 1 %
Eosinophils Absolute: 0.1 10*3/uL (ref 0–0.7)
Eosinophils Relative: 1 %
HCT: 35.9 % (ref 35.0–47.0)
HEMOGLOBIN: 12.2 g/dL (ref 12.0–16.0)
Lymphocytes Relative: 28 %
Lymphs Abs: 2 10*3/uL (ref 1.0–3.6)
MCH: 28.6 pg (ref 26.0–34.0)
MCHC: 34.1 g/dL (ref 32.0–36.0)
MCV: 83.7 fL (ref 80.0–100.0)
Monocytes Absolute: 0.6 10*3/uL (ref 0.2–0.9)
Monocytes Relative: 8 %
NEUTROS ABS: 4.4 10*3/uL (ref 1.4–6.5)
NEUTROS PCT: 62 %
Platelets: 265 10*3/uL (ref 150–440)
RBC: 4.29 MIL/uL (ref 3.80–5.20)
RDW: 13.3 % (ref 11.5–14.5)
WBC: 7 10*3/uL (ref 3.6–11.0)

## 2017-06-24 MED ORDER — AMOXICILLIN 500 MG PO TABS: 1000.0000 mg | ORAL_TABLET | Freq: Two times a day (BID) | ORAL | 0 refills | Status: AC

## 2017-06-24 MED ORDER — IOPAMIDOL (ISOVUE-370) INJECTION 76%
75.0000 mL | Freq: Once | INTRAVENOUS | Status: AC | PRN
  Administered 2017-06-24: 75 mL via INTRAVENOUS

## 2017-06-24 NOTE — ED Notes (Signed)
Patient ambulatory to restroom with steady gait and NAD noted.  

## 2017-06-24 NOTE — ED Notes (Signed)
Patient ambulatory to lobby with steady gait and NAD distress. Verbalized understanding of discharge instructions and follow-up care

## 2017-06-24 NOTE — ED Triage Notes (Addendum)
Patient ambulatory to triage with steady gait, without difficulty or distress noted; pt reports fever x 2wks with no accomp symptoms except for left ear pain & ringing; seen PCP with no findings; family member st that they want her "entire body xrayed as well as her brain"

## 2017-06-24 NOTE — ED Provider Notes (Signed)
[facility] Emergency Department Provider Note  ____________________________________________   First MD Initiated Contact with Patient 06/24/17 0405     (approximate)  I have reviewed the triage vital signs and the nursing notes.   HISTORY  Chief Complaint Fever   HPI Rebekah Oliver is a 72 y.o. female who self presents to the emergency department with 2 weeks of nightly fever.  Her symptoms only began when she lies down to go to sleep and she feels "warmth" in her left ear.  She also describes a sensation of intermittent "whooshing" in her left ear.  Nothing in the right.  She has not measured a temperature.  Her only past medical history is hypertension.  She went to her primary care about 2 weeks ago who referred her to neurology as an outpatient.  She is had no cough.  No chest pain.  No shortness of breath.  No abdominal pain nausea or vomiting.  She reports no pain in her left ear but this intermittent ear ringing and "whooshing".  Her symptoms seem to be worse at night and improves throughout the day.  History reviewed. No pertinent past medical history.  Patient Active Problem List   Diagnosis Date Noted  . Hypertension 02/03/2015    Past Surgical History:  Procedure Laterality Date  . CHOLECYSTECTOMY  2014  . sphincterotomy  2014    Prior to Admission medications   Medication Sig Start Date End Date Taking? Authorizing Provider  amoxicillin (AMOXIL) 500 MG tablet Take 2 tablets (1,000 mg total) by mouth 2 (two) times daily for 7 days. 06/24/17 07/01/17  Merrily Brittle, MD    Allergies Hydrochlorothiazide  No family history on file.  Social History Social History   Tobacco Use  . Smoking status: Never Smoker  . Smokeless tobacco: Never Used  Substance Use Topics  . Alcohol use: No    Alcohol/week: 0.0 oz  . Drug use: Not on file    Review of Systems Constitutional: No fever/chills Eyes: No visual changes. ENT: Positive for  tinnitus Cardiovascular: Denies chest pain. Respiratory: Denies shortness of breath. Gastrointestinal: No abdominal pain.  No nausea, no vomiting.  No diarrhea.  No constipation. Genitourinary: Negative for dysuria. Musculoskeletal: Negative for back pain. Skin: Negative for rash. Neurological: Negative for headaches, focal weakness or numbness.   ____________________________________________   PHYSICAL EXAM:  VITAL SIGNS: ED Triage Vitals  Enc Vitals Group     BP 06/24/17 0049 (!) 148/87     Pulse Rate 06/24/17 0049 98     Resp 06/24/17 0049 18     Temp 06/24/17 0049 98.8 F (37.1 C)     Temp src --      SpO2 06/24/17 0049 97 %     Weight 06/24/17 0048 130 lb (59 kg)     Height 06/24/17 0048  (1.575 m)     Head Circumference --      Peak Flow --      Pain Score 06/24/17 0048 9     Pain Loc --      Pain Edu? --      Excl. in GC? --     Constitutional: Alert and oriented x4 pleasant cooperative speaks full clear sentences no diaphoresis Eyes: PERRL EOMI. Head: Right tympanic membrane normal left tympanic membrane slightly obscured although not bulging or emphysematous Nose: No congestion/rhinnorhea. Mouth/Throat: No trismus Neck: No stridor.   Cardiovascular: Normal rate, regular rhythm. Grossly normal heart sounds.  Good peripheral circulation. Respiratory: Normal  respiratory effort.  No retractions. Lungs CTAB and moving good air Gastrointestinal: Soft nontender Musculoskeletal: No lower extremity edema   Neurologic:  Normal speech and language. No gross focal neurologic deficits are appreciated. Skin:  Skin is warm, dry and intact. No rash noted. Psychiatric: Mood and affect are normal. Speech and behavior are normal.    ____________________________________________   DIFFERENTIAL includes but not limited to  Pneumonia, otitis media, AVM, aneurysm ____________________________________________   LABS (all labs ordered are listed, but only abnormal results  are displayed)  Labs Reviewed  COMPREHENSIVE METABOLIC PANEL - Abnormal; Notable for the following components:      Result Value   Glucose, Bld 104 (*)    BUN 27 (*)    All other components within normal limits  CBC WITH DIFFERENTIAL/PLATELET    Lab work reviewed by me with no acute disease __________________________________________  EKG  ED ECG REPORT I, Merrily Brittle, the attending physician, personally viewed and interpreted this ECG.  Date: 06/24/2017 EKG Time:  Rate: 79 Rhythm: normal sinus rhythm QRS Axis: normal Intervals: normal ST/T Wave abnormalities: normal Narrative Interpretation: no evidence of acute ischemia  ____________________________________________  RADIOLOGY  CT angiogram reviewed by me with no AVM and no aneurysm ____________________________________________   PROCEDURES  Procedure(s) performed: no  Procedures  Critical Care performed: no  Observation: o ____________________________________________   INITIAL IMPRESSION / ASSESSMENT AND PLAN / ED COURSE  Pertinent labs & imaging results that were available during my care of the patient were reviewed by me and considered in my medical decision making (see chart for details).  The patient's left tympanic membrane appears slightly obscured but not bulging.  She is describing what sounds like pulsatile tinnitus and a 72 year old hypertensive woman I am concerned about aneurysm.  CT angiogram of the head and neck are pending.     Fortunately the patient's CT scans are negative for acute pathology.  She very well may have acute otitis media so I will cover her with a 7-day course of amoxicillin and refer her to otolaryngology as needed for evaluation of her pulsatile tinnitus.  She verbalizes understanding and agreement the plan. ____________________________________________   FINAL CLINICAL IMPRESSION(S) / ED DIAGNOSES  Final diagnoses:  Pulsatile tinnitus      NEW MEDICATIONS  STARTED DURING THIS VISIT:  New Prescriptions   AMOXICILLIN (AMOXIL) 500 MG TABLET    Take 2 tablets (1,000 mg total) by mouth 2 (two) times daily for 7 days.     Note:  This document was prepared using Dragon voice recognition software and may include unintentional dictation errors.     Merrily Brittle, MD 06/24/17 0630

## 2017-06-24 NOTE — Discharge Instructions (Signed)
Please take all of your antibiotics as prescribed and follow-up with the otolaryngologist as needed if her symptoms do not improve.  Return to the emergency department sooner for any concerns whatsoever.  It was a pleasure to take care of you today, and thank you for coming to our emergency department.  If you have any questions or concerns before leaving please ask the nurse to grab me and I'm more than happy to go through your aftercare instructions again.  If you were prescribed any opioid pain medication today such as Norco, Vicodin, Percocet, morphine, hydrocodone, or oxycodone please make sure you do not drive when you are taking this medication as it can alter your ability to drive safely.  If you have any concerns once you are home that you are not improving or are in fact getting worse before you can make it to your follow-up appointment, please do not hesitate to call 911 and come back for further evaluation.  Merrily Brittle, MD  Results for orders placed or performed during the hospital encounter of 06/24/17  Comprehensive metabolic panel  Result Value Ref Range   Sodium 139 135 - 145 mmol/L   Potassium 4.2 3.5 - 5.1 mmol/L   Chloride 107 101 - 111 mmol/L   CO2 24 22 - 32 mmol/L   Glucose, Bld 104 (H) 65 - 99 mg/dL   BUN 27 (H) 6 - 20 mg/dL   Creatinine, Ser 1.61 0.44 - 1.00 mg/dL   Calcium 8.9 8.9 - 09.6 mg/dL   Total Protein 7.6 6.5 - 8.1 g/dL   Albumin 4.2 3.5 - 5.0 g/dL   AST 24 15 - 41 U/L   ALT 14 14 - 54 U/L   Alkaline Phosphatase 70 38 - 126 U/L   Total Bilirubin 0.7 0.3 - 1.2 mg/dL   GFR calc non Af Amer >60 >60 mL/min   GFR calc Af Amer >60 >60 mL/min   Anion gap 8 5 - 15  CBC with Differential  Result Value Ref Range   WBC 7.0 3.6 - 11.0 K/uL   RBC 4.29 3.80 - 5.20 MIL/uL   Hemoglobin 12.2 12.0 - 16.0 g/dL   HCT 04.5 40.9 - 81.1 %   MCV 83.7 80.0 - 100.0 fL   MCH 28.6 26.0 - 34.0 pg   MCHC 34.1 32.0 - 36.0 g/dL   RDW 91.4 78.2 - 95.6 %   Platelets 265 150  - 440 K/uL   Neutrophils Relative % 62 %   Neutro Abs 4.4 1.4 - 6.5 K/uL   Lymphocytes Relative 28 %   Lymphs Abs 2.0 1.0 - 3.6 K/uL   Monocytes Relative 8 %   Monocytes Absolute 0.6 0.2 - 0.9 K/uL   Eosinophils Relative 1 %   Eosinophils Absolute 0.1 0 - 0.7 K/uL   Basophils Relative 1 %   Basophils Absolute 0.0 0 - 0.1 K/uL   Ct Angio Head W Or Wo Contrast  Result Date: 06/24/2017 CLINICAL DATA:  Fever and ear pain EXAM: CT ANGIOGRAPHY HEAD AND NECK TECHNIQUE: Multidetector CT imaging of the head and neck was performed using the standard protocol during bolus administration of intravenous contrast. Multiplanar CT image reconstructions and MIPs were obtained to evaluate the vascular anatomy. Carotid stenosis measurements (when applicable) are obtained utilizing NASCET criteria, using the distal internal carotid diameter as the denominator. CONTRAST:  75mL ISOVUE-370 IOPAMIDOL (ISOVUE-370) INJECTION 76% COMPARISON:  None. FINDINGS: CT HEAD FINDINGS BRAIN: No mass lesion, intraparenchymal hemorrhage or extra-axial collection. No evidence  of acute cortical infarct. Normal appearance of the brain parenchyma and extra axial spaces for age. VASCULAR: No hyperdense vessel or unexpected vascular calcification. SKULL: Normal visualized skull base, calvarium and extracranial soft tissues. SINUSES/ORBITS: No sinus fluid levels or advanced mucosal thickening. No mastoid effusion. Normal orbits. CTA NECK FINDINGS AORTIC ARCH: There is no calcific atherosclerosis of the aortic arch. There is no aneurysm, dissection or hemodynamically significant stenosis of the visualized ascending aorta and aortic arch. Conventional 3 vessel aortic branching pattern. The visualized proximal subclavian arteries are widely patent. RIGHT CAROTID SYSTEM: --Common carotid artery: Widely patent origin without common carotid artery dissection or aneurysm. Mild atherosclerotic calcification at the carotid bifurcation without  hemodynamically significant stenosis. --Internal carotid artery: No dissection, occlusion or aneurysm. No hemodynamically significant stenosis. --External carotid artery: No acute abnormality. LEFT CAROTID SYSTEM: --Common carotid artery: Widely patent origin without common carotid artery dissection or aneurysm. Moderate atherosclerotic calcification at the carotid bifurcation without hemodynamically significant stenosis. --Internal carotid artery:No dissection, occlusion or aneurysm. No hemodynamically significant stenosis. --External carotid artery: No acute abnormality. VERTEBRAL ARTERIES: Left dominant configuration. Both origins are normal. No dissection, occlusion or flow-limiting stenosis to the vertebrobasilar confluence. SKELETON: There is no bony spinal canal stenosis. No lytic or blastic lesion. OTHER NECK: No cervical lymphadenopathy or soft tissue mass. UPPER CHEST: Nodular opacity of the right lung apex with adjacent calcification is probably focal scarring. On the lowest images, there are multiple nodular opacities within the lungs, incompletely visualized. Hilar lymph nodes are also and clearly visualized. CTA HEAD FINDINGS ANTERIOR CIRCULATION: --Intracranial internal carotid arteries: Normal. --Anterior cerebral arteries: Normal. Both A1 segments are present. Patent anterior communicating artery. --Middle cerebral arteries: Normal. --Posterior communicating arteries: Absent bilaterally. POSTERIOR CIRCULATION: --Basilar artery: Normal. --Posterior cerebral arteries: Normal. --Superior cerebellar arteries: Normal. --Inferior cerebellar arteries: Normal anterior and posterior inferior cerebellar arteries. VENOUS SINUSES: As permitted by contrast timing, patent. ANATOMIC VARIANTS: None DELAYED PHASE: No parenchymal contrast enhancement. Review of the MIP images confirms the above findings. IMPRESSION: 1. No emergent large vessel occlusion or hemodynamically significant stenosis of the arteries of the  head and neck. 2. Incompletely visualized pulmonary nodules and hilar lymphadenopathy. Dedicated chest CT is recommended. 3. Aortic Atherosclerosis (ICD10-I70.0). Bilateral carotid bifurcation atherosclerotic calcification. Electronically Signed   By: Deatra Robinson M.D.   On: 06/24/2017 06:01   Dg Chest 2 View  Result Date: 06/24/2017 CLINICAL DATA:  72 year old female with cough EXAM: CHEST - 2 VIEW COMPARISON:  Chest radiograph dated 11/22/2012 FINDINGS: There is mild diffuse interstitial prominence. There is no focal consolidation, pleural effusion, or pneumothorax. The cardiac silhouette is within normal limits. Lower thoracic old compression deformity and anterior wedging with resulting kyphosis. No acute osseous pathology. IMPRESSION: Mild diffuse interstitial prominence may be chronic or represent viral infection. Clinical correlation is recommended. No focal consolidation. Electronically Signed   By: Elgie Collard M.D.   On: 06/24/2017 04:54   Ct Angio Neck W And/or Wo Contrast  Result Date: 06/24/2017 CLINICAL DATA:  Fever and ear pain EXAM: CT ANGIOGRAPHY HEAD AND NECK TECHNIQUE: Multidetector CT imaging of the head and neck was performed using the standard protocol during bolus administration of intravenous contrast. Multiplanar CT image reconstructions and MIPs were obtained to evaluate the vascular anatomy. Carotid stenosis measurements (when applicable) are obtained utilizing NASCET criteria, using the distal internal carotid diameter as the denominator. CONTRAST:  75mL ISOVUE-370 IOPAMIDOL (ISOVUE-370) INJECTION 76% COMPARISON:  None. FINDINGS: CT HEAD FINDINGS BRAIN: No mass lesion, intraparenchymal hemorrhage or extra-axial  collection. No evidence of acute cortical infarct. Normal appearance of the brain parenchyma and extra axial spaces for age. VASCULAR: No hyperdense vessel or unexpected vascular calcification. SKULL: Normal visualized skull base, calvarium and extracranial soft tissues.  SINUSES/ORBITS: No sinus fluid levels or advanced mucosal thickening. No mastoid effusion. Normal orbits. CTA NECK FINDINGS AORTIC ARCH: There is no calcific atherosclerosis of the aortic arch. There is no aneurysm, dissection or hemodynamically significant stenosis of the visualized ascending aorta and aortic arch. Conventional 3 vessel aortic branching pattern. The visualized proximal subclavian arteries are widely patent. RIGHT CAROTID SYSTEM: --Common carotid artery: Widely patent origin without common carotid artery dissection or aneurysm. Mild atherosclerotic calcification at the carotid bifurcation without hemodynamically significant stenosis. --Internal carotid artery: No dissection, occlusion or aneurysm. No hemodynamically significant stenosis. --External carotid artery: No acute abnormality. LEFT CAROTID SYSTEM: --Common carotid artery: Widely patent origin without common carotid artery dissection or aneurysm. Moderate atherosclerotic calcification at the carotid bifurcation without hemodynamically significant stenosis. --Internal carotid artery:No dissection, occlusion or aneurysm. No hemodynamically significant stenosis. --External carotid artery: No acute abnormality. VERTEBRAL ARTERIES: Left dominant configuration. Both origins are normal. No dissection, occlusion or flow-limiting stenosis to the vertebrobasilar confluence. SKELETON: There is no bony spinal canal stenosis. No lytic or blastic lesion. OTHER NECK: No cervical lymphadenopathy or soft tissue mass. UPPER CHEST: Nodular opacity of the right lung apex with adjacent calcification is probably focal scarring. On the lowest images, there are multiple nodular opacities within the lungs, incompletely visualized. Hilar lymph nodes are also and clearly visualized. CTA HEAD FINDINGS ANTERIOR CIRCULATION: --Intracranial internal carotid arteries: Normal. --Anterior cerebral arteries: Normal. Both A1 segments are present. Patent anterior communicating  artery. --Middle cerebral arteries: Normal. --Posterior communicating arteries: Absent bilaterally. POSTERIOR CIRCULATION: --Basilar artery: Normal. --Posterior cerebral arteries: Normal. --Superior cerebellar arteries: Normal. --Inferior cerebellar arteries: Normal anterior and posterior inferior cerebellar arteries. VENOUS SINUSES: As permitted by contrast timing, patent. ANATOMIC VARIANTS: None DELAYED PHASE: No parenchymal contrast enhancement. Review of the MIP images confirms the above findings. IMPRESSION: 1. No emergent large vessel occlusion or hemodynamically significant stenosis of the arteries of the head and neck. 2. Incompletely visualized pulmonary nodules and hilar lymphadenopathy. Dedicated chest CT is recommended. 3. Aortic Atherosclerosis (ICD10-I70.0). Bilateral carotid bifurcation atherosclerotic calcification. Electronically Signed   By: Deatra Robinson M.D.   On: 06/24/2017 06:01

## 2021-01-06 DIAGNOSIS — I10 Essential (primary) hypertension: Secondary | ICD-10-CM | POA: Diagnosis present

## 2021-01-06 NOTE — ED Triage Notes (Signed)
Pt to ED from home c/o feeling flushed, skin red, and feeling hot.  Denies n/v/d, denies cough, denies pain.  States planning on seeing PCP in the morning.  Pt A&Ox4, chest rise even and unlabored, skin appears WNL and in NAD at this time.  Pt speaks [place], here with daughter.

## 2021-01-07 ENCOUNTER — Other Ambulatory Visit: Payer: Self-pay

## 2021-01-07 ENCOUNTER — Emergency Department
Admission: EM | Admit: 2021-01-07 | Discharge: 2021-01-07 | Disposition: A | Discharge status: Home / Self Care | Payer: Medicare Other | Attending: Emergency Medicine | Admitting: Emergency Medicine

## 2021-01-07 ENCOUNTER — Encounter: Payer: Self-pay | Admitting: Emergency Medicine

## 2021-01-07 DIAGNOSIS — I10 Essential (primary) hypertension: Secondary | ICD-10-CM

## 2021-01-07 NOTE — ED Notes (Signed)
PT ROOMED TO FLEX AND REPORTS EPISTAXIS AFTER BEING ROOMED. RIGHT NARES WITH MINIMAL BLOOD, STOPPED WITH TISSUE.

## 2021-01-07 NOTE — ED Provider Notes (Signed)
[facility] Emergency Department Provider Note  ____________________________________________   Event Date/Time   First MD Initiated Contact with Patient 01/07/21 [phone]     (approximate)  I have reviewed the triage vital signs and the nursing notes.   HISTORY  Chief Complaint Flushed    HPI Rebekah Oliver is a 75 y.o. female with history of hypertension who presents to the emergency department with elevated blood pressure at home.  Family reports that she checks her blood pressure every day and her systolic was 175.  She had no symptoms.  No headache, vision changes, chest pain, shortness of breath, numbness, tingling or focal weakness.  She is on amlodipine 5 mg daily and lisinopril/HCTZ 20 mg / 25 mg daily.  Family reports that her blood pressure is normally in the 120 systolic.  She has a PCP for follow-up.  Currently on my evaluation her blood pressure is 157/76.  Family reports sometimes when her blood pressure is high she will get flushed where she feels hot all over.      History reviewed. No pertinent past medical history.  Patient Active Problem List   Diagnosis Date Noted   Hypertension 02/03/2015    Past Surgical History:  Procedure Laterality Date   CHOLECYSTECTOMY  2014   sphincterotomy  2014    Prior to Admission medications   Not on File    Allergies Hydrochlorothiazide  History reviewed. No pertinent family history.  Social History Social History   Tobacco Use   Smoking status: Never   Smokeless tobacco: Never  Substance Use Topics   Alcohol use: No    Alcohol/week: 0.0 standard drinks    Review of Systems Constitutional: No fever. Eyes: No visual changes. ENT: No sore throat. Cardiovascular: Denies chest pain. Respiratory: Denies shortness of breath. Gastrointestinal: No nausea, vomiting, diarrhea. Genitourinary: Negative for dysuria. Musculoskeletal: Negative for back pain. Skin: Negative for  rash. Neurological: Negative for focal weakness or numbness.  ____________________________________________   PHYSICAL EXAM:  VITAL SIGNS: ED Triage Vitals [01/07/21 0000]  Enc Vitals Group     BP (!) 166/82     Pulse Rate 95     Resp 16     Temp 98.2 F (36.8 C)     Temp Source Oral     SpO2 95 %     Weight 150 lb (68 kg)     Height 5\' 2"  (1.575 m)     Head Circumference      Peak Flow      Pain Score 0     Pain Loc      Pain Edu?      Excl. in GC?    CONSTITUTIONAL: Alert and oriented and responds appropriately to questions. Well-appearing; well-nourished HEAD: Normocephalic EYES: Conjunctivae clear, pupils appear equal, EOM appear intact ENT: normal nose; moist mucous membranes NECK: Supple, normal ROM CARD: RRR; S1 and S2 appreciated; no murmurs, no clicks, no rubs, no gallops RESP: Normal chest excursion without splinting or tachypnea; breath sounds clear and equal bilaterally; no wheezes, no rhonchi, no rales, no hypoxia or respiratory distress, speaking full sentences ABD/GI: Normal bowel sounds; non-distended; soft, non-tender, no rebound, no guarding, no peritoneal signs, no hepatosplenomegaly BACK: The back appears normal EXT: Normal ROM in all joints; no deformity noted, no edema; no cyanosis SKIN: Normal color for age and race; warm; no rash on exposed skin NEURO: Moves all extremities equally, normal speech, no facial asymmetry PSYCH: The patient's mood and manner are appropriate.  ____________________________________________  LABS (all labs ordered are listed, but only abnormal results are displayed)  Labs Reviewed - No data to display ____________________________________________  EKG   ____________________________________________  RADIOLOGY I, Armie Moren, personally viewed and evaluated these images (plain radiographs) as part of my medical decision making, as well as reviewing the written report by the radiologist.  ED MD interpretation:     Official radiology report(s): No results found.  ____________________________________________   PROCEDURES  Procedure(s) performed (including Critical Care):  Procedures    ____________________________________________   INITIAL IMPRESSION / ASSESSMENT AND PLAN / ED COURSE  As part of my medical decision making, I reviewed the following data within the electronic MEDICAL RECORD NUMBER History obtained from family, Nursing notes reviewed and incorporated, Old chart reviewed, and Notes from prior ED visits         Patient here with asymptomatic hypertension.  On my evaluation her blood pressure is currently in the 150s/70s.  Discussed with family that I do not think we should adjust her blood pressure medications here from the emergency department and that she should follow-up closely with her primary care doctor especially given she will have times where her blood pressure is normal in the 120s.  We discussed symptoms to look out for and how to monitor her blood pressure at home.  Patient and family at bedside are comfortable with this plan will call their PCP in the morning.  She is well-appearing here and I do not feel needs further emergent work-up especially given she is asymptomatic.  At this time, I do not feel there is any life-threatening condition present. I have reviewed, interpreted and discussed all results (EKG, imaging, lab, urine as appropriate) and exam findings with patient/family. I have reviewed nursing notes and appropriate previous records.  I feel the patient is safe to be discharged home without further emergent workup and can continue workup as an outpatient as needed. Discussed usual and customary return precautions. Patient/family verbalize understanding and are comfortable with this plan.  Outpatient follow-up has been provided as needed. All questions have been answered.  ____________________________________________   FINAL CLINICAL IMPRESSION(S) / ED  DIAGNOSES  Final diagnoses:  Asymptomatic hypertension     ED Discharge Orders     None       *Please note:  Rebekah Oliver was evaluated in Emergency Department on 01/07/2021 for the symptoms described in the history of present illness. She was evaluated in the context of the global COVID-19 pandemic, which necessitated consideration that the patient might be at risk for infection with the SARS-CoV-2 virus that causes COVID-19. Institutional protocols and algorithms that pertain to the evaluation of patients at risk for COVID-19 are in a state of rapid change based on information released by regulatory bodies including the CDC and federal and state organizations. These policies and algorithms were followed during the patient's care in the ED.  Some ED evaluations and interventions may be delayed as a result of limited staffing during and the pandemic.*   Note:  This document was prepared using Dragon voice recognition software and may include unintentional dictation errors.    Shawnita Krizek, Layla Maw, DO 01/07/21 [phone]

## 2021-01-22 ENCOUNTER — Other Ambulatory Visit: Payer: Self-pay | Admitting: Nurse Practitioner

## 2021-01-22 DIAGNOSIS — R1013 Epigastric pain: Secondary | ICD-10-CM

## 2021-01-22 DIAGNOSIS — Z9049 Acquired absence of other specified parts of digestive tract: Secondary | ICD-10-CM

## 2021-01-25 ENCOUNTER — Encounter: Payer: Self-pay | Admitting: Nurse Practitioner

## 2021-01-31 ENCOUNTER — Emergency Department
Admission: EM | Admit: 2021-01-31 | Discharge: 2021-01-31 | Disposition: A | Discharge status: Home / Self Care | Payer: Medicare Other | Attending: Emergency Medicine | Admitting: Emergency Medicine

## 2021-01-31 ENCOUNTER — Other Ambulatory Visit: Payer: Self-pay

## 2021-01-31 ENCOUNTER — Emergency Department: Payer: Medicare Other

## 2021-01-31 DIAGNOSIS — I10 Essential (primary) hypertension: Secondary | ICD-10-CM | POA: Diagnosis not present

## 2021-01-31 DIAGNOSIS — R109 Unspecified abdominal pain: Secondary | ICD-10-CM | POA: Diagnosis present

## 2021-01-31 DIAGNOSIS — K2900 Acute gastritis without bleeding: Secondary | ICD-10-CM | POA: Insufficient documentation

## 2021-01-31 DIAGNOSIS — K5900 Constipation, unspecified: Secondary | ICD-10-CM | POA: Insufficient documentation

## 2021-01-31 DIAGNOSIS — Z79899 Other long term (current) drug therapy: Secondary | ICD-10-CM | POA: Diagnosis not present

## 2021-01-31 DIAGNOSIS — R300 Dysuria: Secondary | ICD-10-CM | POA: Insufficient documentation

## 2021-01-31 DIAGNOSIS — R1013 Epigastric pain: Secondary | ICD-10-CM

## 2021-01-31 DIAGNOSIS — R350 Frequency of micturition: Secondary | ICD-10-CM | POA: Insufficient documentation

## 2021-01-31 LAB — COMPREHENSIVE METABOLIC PANEL
ALT: 22 U/L (ref 0–44)
AST: 25 U/L (ref 15–41)
Albumin: 4.2 g/dL (ref 3.5–5.0)
Alkaline Phosphatase: 68 U/L (ref 38–126)
Anion gap: 8 (ref 5–15)
BUN: 33 mg/dL — ABNORMAL HIGH (ref 8–23)
CO2: 23 mmol/L (ref 22–32)
Calcium: 9.1 mg/dL (ref 8.9–10.3)
Chloride: 101 mmol/L (ref 98–111)
Creatinine, Ser: 0.99 mg/dL (ref 0.44–1.00)
GFR, Estimated: 60 mL/min — ABNORMAL LOW (ref >60)
Glucose, Bld: 129 mg/dL — ABNORMAL HIGH (ref 70–99)
Potassium: 4.4 mmol/L (ref 3.5–5.1)
Sodium: 132 mmol/L — ABNORMAL LOW (ref 135–145)
Total Bilirubin: 0.6 mg/dL (ref 0.3–1.2)
Total Protein: 7.8 g/dL (ref 6.5–8.1)

## 2021-01-31 LAB — URINALYSIS, ROUTINE W REFLEX MICROSCOPIC
Bilirubin Urine: NEGATIVE
Glucose, UA: NEGATIVE mg/dL
Hgb urine dipstick: NEGATIVE
Ketones, ur: NEGATIVE mg/dL
Leukocytes,Ua: NEGATIVE
Nitrite: NEGATIVE
Protein, ur: NEGATIVE mg/dL
Specific Gravity, Urine: 1.006 (ref 1.005–1.030)
pH: 5 (ref 5.0–8.0)

## 2021-01-31 LAB — CBC
HCT: 34.8 % — ABNORMAL LOW (ref 36.0–46.0)
Hemoglobin: 11.7 g/dL — ABNORMAL LOW (ref 12.0–15.0)
MCH: 28.7 pg (ref 26.0–34.0)
MCHC: 33.6 g/dL (ref 30.0–36.0)
MCV: 85.3 fL (ref 80.0–100.0)
Platelets: 255 10*3/uL (ref 150–400)
RBC: 4.08 MIL/uL (ref 3.87–5.11)
RDW: 12.8 % (ref 11.5–15.5)
WBC: 5.9 10*3/uL (ref 4.0–10.5)
nRBC: 0 % (ref 0.0–0.2)

## 2021-01-31 LAB — LIPASE, BLOOD: Lipase: 61 U/L — ABNORMAL HIGH (ref 11–51)

## 2021-01-31 LAB — TROPONIN I (HIGH SENSITIVITY): Troponin I (High Sensitivity): 5 ng/L (ref <18)

## 2021-01-31 MED ORDER — LACTATED RINGERS IV BOLUS
1000.0000 mL | Freq: Once | INTRAVENOUS | Status: AC
  Administered 2021-01-31: 1000 mL via INTRAVENOUS

## 2021-01-31 MED ORDER — ONDANSETRON HCL 4 MG/2ML IJ SOLN
4.0000 mg | Freq: Once | INTRAMUSCULAR | Status: AC
  Administered 2021-01-31: 4 mg via INTRAVENOUS
  Filled 2021-01-31: qty 2

## 2021-01-31 MED ORDER — MORPHINE SULFATE (PF) 4 MG/ML IV SOLN
4.0000 mg | Freq: Once | INTRAVENOUS | Status: AC
  Administered 2021-01-31: 4 mg via INTRAVENOUS
  Filled 2021-01-31: qty 1

## 2021-01-31 MED ORDER — DOCUSATE SODIUM 100 MG PO CAPS: 100.0000 mg | ORAL_CAPSULE | Freq: Two times a day (BID) | ORAL | 0 refills | Status: AC

## 2021-01-31 MED ORDER — OMEPRAZOLE MAGNESIUM 20 MG PO TBEC: 20.0000 mg | DELAYED_RELEASE_TABLET | Freq: Every day | ORAL | 1 refills | Status: DC

## 2021-01-31 MED ORDER — IOHEXOL 300 MG/ML  SOLN
100.0000 mL | Freq: Once | INTRAMUSCULAR | Status: AC | PRN
  Administered 2021-01-31: 100 mL via INTRAVENOUS
  Filled 2021-01-31: qty 100

## 2021-01-31 NOTE — Discharge Instructions (Signed)

## 2021-01-31 NOTE — ED Triage Notes (Signed)
Pt here with daughter who states pt is having abd pain and is up to the bathroom often- pt has a hx of kidney stones- pt also feeling more weak than usual- pt states it started 1 month ago- pt has also been having diarrhea

## 2021-01-31 NOTE — ED Provider Notes (Signed)
[facility] Emergency Department Provider Note   ____________________________________________   Event Date/Time   First MD Initiated Contact with Patient 01/31/21 1309     (approximate)  I have reviewed the triage vital signs and the nursing notes.   HISTORY  Chief Complaint Abdominal Pain    HPI Rebekah Oliver is a 75 y.o. female with past medical history of hypertension and cholecystectomy who presents to the ED complaining of abdominal pain.  Majority of history is obtained from patient's daughter as patient primarily speaks [place].  Daughter states that the patient has been complaining of intermittent pain in her upper abdomen for about the past month that has become more severe over the past couple of days.  She describes it as a burning discomfort that radiates around to both flanks, is not exacerbated or alleviated by anything in particular.  She has been feeling nauseous with this but has not vomited, does endorse diarrhea.  She additionally has been urinating frequently and reports some burning when she pees.  She has not had any fevers and denies any cough, chest pain, or shortness of breath.  She has not been taking anything for her symptoms at home.        History reviewed. No pertinent past medical history.  Patient Active Problem List   Diagnosis Date Noted   Hypertension 02/03/2015    Past Surgical History:  Procedure Laterality Date   CHOLECYSTECTOMY  2014   sphincterotomy  2014    Prior to Admission medications   Medication Sig Start Date End Date Taking? Authorizing Provider  docusate sodium (COLACE) 100 MG capsule Take 1 capsule (100 mg total) by mouth 2 (two) times daily. 01/31/21 03/02/21 Yes Chesley Noon, MD  omeprazole (PRILOSEC OTC) 20 MG tablet Take 1 tablet (20 mg total) by mouth daily. 01/31/21 01/31/22 Yes Chesley Noon, MD    Allergies Hydrochlorothiazide  No family history on file.  Social History Social  History   Tobacco Use   Smoking status: Never   Smokeless tobacco: Never  Substance Use Topics   Alcohol use: No    Alcohol/week: 0.0 standard drinks    Review of Systems  Constitutional: No fever/chills Eyes: No visual changes. ENT: No sore throat. Cardiovascular: Denies chest pain. Respiratory: Denies shortness of breath. Gastrointestinal: Positive for abdominal pain, and nausea, no vomiting.  Positive for diarrhea.  No constipation. Genitourinary: Positive for frequency and dysuria. Musculoskeletal: Negative for back pain. Skin: Negative for rash. Neurological: Negative for headaches, focal weakness or numbness.  ____________________________________________   PHYSICAL EXAM:  VITAL SIGNS: ED Triage Vitals  Enc Vitals Group     BP 01/31/21 1131 134/81     Pulse Rate 01/31/21 1131 86     Resp 01/31/21 1131 20     Temp 01/31/21 1131 97.9 F (36.6 C)     Temp Source 01/31/21 1131 Oral     SpO2 01/31/21 1131 96 %     Weight 01/31/21 1131 175 lb (79.4 kg)     Height 01/31/21 1131 5\' 2"  (1.575 m)     Head Circumference --      Peak Flow --      Pain Score 01/31/21 1141 8     Pain Loc --      Pain Edu? --      Excl. in GC? --     Constitutional: Alert and oriented. Eyes: Conjunctivae are normal. Head: Atraumatic. Nose: No congestion/rhinnorhea. Mouth/Throat: Mucous membranes are moist. Neck: Normal ROM Cardiovascular: Normal  rate, regular rhythm. Grossly normal heart sounds.  2+ radial pulses bilaterally. Respiratory: Normal respiratory effort.  No retractions. Lungs CTAB. Gastrointestinal: Soft and tender to palpation in bilateral upper quadrants with no rebound or guarding.  Right CVA tenderness to palpation noted. No distention. Genitourinary: deferred Musculoskeletal: No lower extremity tenderness nor edema. Neurologic:  Normal speech and language. No gross focal neurologic deficits are appreciated. Skin:  Skin is warm, dry and intact. No rash  noted. Psychiatric: Mood and affect are normal. Speech and behavior are normal.  ____________________________________________   LABS (all labs ordered are listed, but only abnormal results are displayed)  Labs Reviewed  LIPASE, BLOOD - Abnormal; Notable for the following components:      Result Value   Lipase 61 (*)    All other components within normal limits  COMPREHENSIVE METABOLIC PANEL - Abnormal; Notable for the following components:   Sodium 132 (*)    Glucose, Bld 129 (*)    BUN 33 (*)    GFR, Estimated 60 (*)    All other components within normal limits  CBC - Abnormal; Notable for the following components:   Hemoglobin 11.7 (*)    HCT 34.8 (*)    All other components within normal limits  URINALYSIS, ROUTINE W REFLEX MICROSCOPIC - Abnormal; Notable for the following components:   Color, Urine STRAW (*)    APPearance CLEAR (*)    All other components within normal limits  TROPONIN I (HIGH SENSITIVITY)   ____________________________________________  EKG  ED ECG REPORT I, Chesley Noon, the attending physician, personally viewed and interpreted this ECG.   Date: 01/31/2021  EKG Time: 13:48  Rate: 78  Rhythm: normal sinus rhythm  Axis: Normal  Intervals:none  ST&T Change: None   PROCEDURES  Procedure(s) performed (including Critical Care):  Procedures   ____________________________________________   INITIAL IMPRESSION / ASSESSMENT AND PLAN / ED COURSE      76 year old female with past medical history of hypertension and cholecystectomy who presents to the ED with intermittent upper abdominal pain for the past month associated with nausea, diarrhea, and dysuria.  Patient has tenderness to palpation in her bilateral upper quadrants along with her right costovertebral area.  UA is pending but initial labs are remarkable only for mildly elevated lipase.  Patient has had elevated lipase in the past and pancreatitis remains on the differential.  We will  further assess with CT scan for evidence of pancreatitis, nephrolithiasis, or retained gallstones.  CT scan is negative for acute process, no evidence to suggest pancreatitis.  UA shows no signs of infection and given her epigastric discomfort described as a burning, suspect symptoms are due to a gastritis.  We will start patient on omeprazole, daughter also clarifies that patient is dealing with constipation rather than diarrhea.  We will start patient on stool softener and daughter was counseled on using MiraLAX as needed.  She was counseled to have patient follow-up with her PCP and to return to the ED for new worsening symptoms, daughter agrees with plan.      ____________________________________________   FINAL CLINICAL IMPRESSION(S) / ED DIAGNOSES  Final diagnoses:  Epigastric pain  Acute gastritis without hemorrhage, unspecified gastritis type  Constipation, unspecified constipation type     ED Discharge Orders          Ordered    omeprazole (PRILOSEC OTC) 20 MG tablet  Daily        01/31/21 1624    docusate sodium (COLACE) 100 MG capsule  2  times daily        01/31/21 1624             Note:  This document was prepared using Dragon voice recognition software and may include unintentional dictation errors.    Chesley Noon, MD 01/31/21 [phone]

## 2021-02-08 ENCOUNTER — Emergency Department
Admission: EM | Admit: 2021-02-08 | Discharge: 2021-02-08 | Disposition: A | Discharge status: Home / Self Care | Payer: Medicare Other | Attending: Emergency Medicine | Admitting: Emergency Medicine

## 2021-02-08 ENCOUNTER — Emergency Department: Payer: Medicare Other

## 2021-02-08 ENCOUNTER — Other Ambulatory Visit: Payer: Self-pay

## 2021-02-08 DIAGNOSIS — R1013 Epigastric pain: Secondary | ICD-10-CM | POA: Diagnosis present

## 2021-02-08 DIAGNOSIS — I10 Essential (primary) hypertension: Secondary | ICD-10-CM | POA: Insufficient documentation

## 2021-02-08 DIAGNOSIS — K5900 Constipation, unspecified: Secondary | ICD-10-CM | POA: Insufficient documentation

## 2021-02-08 DIAGNOSIS — K2100 Gastro-esophageal reflux disease with esophagitis, without bleeding: Secondary | ICD-10-CM

## 2021-02-08 DIAGNOSIS — K551 Chronic vascular disorders of intestine: Secondary | ICD-10-CM | POA: Diagnosis not present

## 2021-02-08 LAB — COMPREHENSIVE METABOLIC PANEL
ALT: 17 U/L (ref 0–44)
AST: 20 U/L (ref 15–41)
Albumin: 4.5 g/dL (ref 3.5–5.0)
Alkaline Phosphatase: 73 U/L (ref 38–126)
Anion gap: 9 (ref 5–15)
BUN: 21 mg/dL (ref 8–23)
CO2: 24 mmol/L (ref 22–32)
Calcium: 9.7 mg/dL (ref 8.9–10.3)
Chloride: 104 mmol/L (ref 98–111)
Creatinine, Ser: 0.92 mg/dL (ref 0.44–1.00)
GFR, Estimated: >60 mL/min (ref >60)
Glucose, Bld: 114 mg/dL — ABNORMAL HIGH (ref 70–99)
Potassium: 4.2 mmol/L (ref 3.5–5.1)
Sodium: 137 mmol/L (ref 135–145)
Total Bilirubin: 0.5 mg/dL (ref 0.3–1.2)
Total Protein: 8.2 g/dL — ABNORMAL HIGH (ref 6.5–8.1)

## 2021-02-08 LAB — CBC WITH DIFFERENTIAL/PLATELET
Abs Immature Granulocytes: 0.02 10*3/uL (ref 0.00–0.07)
Basophils Absolute: 0 10*3/uL (ref 0.0–0.1)
Basophils Relative: 0 %
Eosinophils Absolute: 0 10*3/uL (ref 0.0–0.5)
Eosinophils Relative: 1 %
HCT: 36.4 % (ref 36.0–46.0)
Hemoglobin: 12.2 g/dL (ref 12.0–15.0)
Immature Granulocytes: 0 %
Lymphocytes Relative: 26 %
Lymphs Abs: 1.7 10*3/uL (ref 0.7–4.0)
MCH: 28.6 pg (ref 26.0–34.0)
MCHC: 33.5 g/dL (ref 30.0–36.0)
MCV: 85.4 fL (ref 80.0–100.0)
Monocytes Absolute: 0.5 10*3/uL (ref 0.1–1.0)
Monocytes Relative: 7 %
Neutro Abs: 4.2 10*3/uL (ref 1.7–7.7)
Neutrophils Relative %: 66 %
Platelets: 262 10*3/uL (ref 150–400)
RBC: 4.26 MIL/uL (ref 3.87–5.11)
RDW: 12.9 % (ref 11.5–15.5)
WBC: 6.4 10*3/uL (ref 4.0–10.5)
nRBC: 0 % (ref 0.0–0.2)

## 2021-02-08 LAB — LACTIC ACID, PLASMA: Lactic Acid, Venous: 0.9 mmol/L (ref 0.5–1.9)

## 2021-02-08 LAB — LIPASE, BLOOD: Lipase: 53 U/L — ABNORMAL HIGH (ref 11–51)

## 2021-02-08 LAB — TROPONIN I (HIGH SENSITIVITY): Troponin I (High Sensitivity): 7 ng/L (ref <18)

## 2021-02-08 MED ORDER — KETOROLAC TROMETHAMINE 30 MG/ML IJ SOLN
15.0000 mg | Freq: Once | INTRAMUSCULAR | Status: DC
  Filled 2021-02-08: qty 1

## 2021-02-08 MED ORDER — MORPHINE SULFATE (PF) 2 MG/ML IV SOLN
2.0000 mg | Freq: Once | INTRAVENOUS | Status: AC
  Administered 2021-02-08: 23:00:00 2 mg via INTRAVENOUS
  Filled 2021-02-08: qty 1

## 2021-02-08 MED ORDER — FAMOTIDINE 20 MG PO TABS: 20.0000 mg | ORAL_TABLET | Freq: Two times a day (BID) | ORAL | 0 refills | Status: DC

## 2021-02-08 MED ORDER — FAMOTIDINE IN NACL 20-0.9 MG/50ML-% IV SOLN
20.0000 mg | Freq: Once | INTRAVENOUS | Status: AC
  Administered 2021-02-08: 21:00:00 20 mg via INTRAVENOUS
  Filled 2021-02-08: qty 50

## 2021-02-08 MED ORDER — IOHEXOL 350 MG/ML SOLN
100.0000 mL | Freq: Once | INTRAVENOUS | Status: AC | PRN
  Administered 2021-02-08: 22:00:00 100 mL via INTRAVENOUS
  Filled 2021-02-08: qty 100

## 2021-02-08 MED ORDER — SUCRALFATE 1 G PO TABS: 1.0000 g | ORAL_TABLET | Freq: Three times a day (TID) | ORAL | 0 refills | Status: DC

## 2021-02-08 MED ORDER — ONDANSETRON HCL 4 MG/2ML IJ SOLN
4.0000 mg | Freq: Once | INTRAMUSCULAR | Status: AC
  Administered 2021-02-08: 21:00:00 4 mg via INTRAVENOUS
  Filled 2021-02-08: qty 2

## 2021-02-08 MED ORDER — BISACODYL 10 MG RE SUPP: 10.0000 mg | RECTAL | 0 refills | Status: DC | PRN

## 2021-02-08 MED ORDER — ALUM & MAG HYDROXIDE-SIMETH 200-200-20 MG/5ML PO SUSP
30.0000 mL | Freq: Once | ORAL | Status: AC
  Administered 2021-02-08: 21:00:00 30 mL via ORAL
  Filled 2021-02-08: qty 30

## 2021-02-08 MED ORDER — LACTULOSE 10 GM/15ML PO SOLN
20.0000 g | Freq: Once | ORAL | Status: AC
  Administered 2021-02-08: 21:00:00 20 g via ORAL
  Filled 2021-02-08: qty 30

## 2021-02-08 MED ORDER — BISACODYL 10 MG RE SUPP
10.0000 mg | Freq: Once | RECTAL | Status: DC
  Filled 2021-02-08: qty 1

## 2021-02-08 MED ORDER — SODIUM CHLORIDE 0.9 % IV BOLUS
1000.0000 mL | Freq: Once | INTRAVENOUS | Status: AC
  Administered 2021-02-08: 21:00:00 1000 mL via INTRAVENOUS

## 2021-02-08 MED ORDER — LIDOCAINE VISCOUS HCL 2 % MT SOLN
15.0000 mL | Freq: Once | OROMUCOSAL | Status: AC
  Administered 2021-02-08: 21:00:00 15 mL via ORAL
  Filled 2021-02-08: qty 15

## 2021-02-08 MED ORDER — LACTULOSE 10 GM/15ML PO SOLN
10.0000 g | Freq: Three times a day (TID) | ORAL | 0 refills | Status: DC | PRN
Start: 2021-02-08 — End: 2021-03-05

## 2021-02-08 MED ORDER — LACTULOSE 10 GM/15ML PO SOLN: 20.0000 g | Freq: Two times a day (BID) | ORAL | 0 refills | Status: DC | PRN

## 2021-02-08 NOTE — ED Provider Notes (Signed)
[facility] Emergency Department Provider Note  ____________________________________________   Event Date/Time   First MD Initiated Contact with Patient 02/08/21 2013     (approximate)  I have reviewed the triage vital signs and the nursing notes.   HISTORY  Chief Complaint Constipation    HPI Rebekah Oliver is a 75 y.o. female  with h/o HTN here with epigastric pain. Pt reports that for the last 1 week or so, she has had constant aching, burnign discomfort in her epigastric area. She has had decreased appetite and had significant worsening with any solid food intake, so she has only been drinking and eating soups. Reports associated fatigue and now constipation, with no BM x several days. Reports the pain does somewhat worsen w/ eating. No other specific alleviating or aggravating factors. Has been taking a PPI w/o significant relief. No other complaints. No CP, SOB, palpitations.        History reviewed. No pertinent past medical history.  Patient Active Problem List   Diagnosis Date Noted   Hypertension 02/03/2015    Past Surgical History:  Procedure Laterality Date   CHOLECYSTECTOMY  2014   sphincterotomy  2014    Prior to Admission medications   Medication Sig Start Date End Date Taking? Authorizing Provider  bisacodyl (DULCOLAX) 10 MG suppository Place 1 suppository (10 mg total) rectally as needed for moderate constipation. 02/08/21  Yes Shaune Pollack, MD  docusate sodium (COLACE) 100 MG capsule Take 1 capsule (100 mg total) by mouth 2 (two) times daily. 01/31/21 03/02/21  Chesley Noon, MD  lactulose (CHRONULAC) 10 GM/15ML solution Take 15 mLs (10 g total) by mouth 3 (three) times daily as needed for moderate constipation or severe constipation. 02/08/21   Shaune Pollack, MD  omeprazole (PRILOSEC OTC) 20 MG tablet Take 1 tablet (20 mg total) by mouth daily. 01/31/21 01/31/22  Chesley Noon, MD  sucralfate (CARAFATE) 1 g tablet Take 1  tablet (1 g total) by mouth 4 (four) times daily -  with meals and at bedtime for 7 days. 02/08/21 02/15/21  Shaune Pollack, MD    Allergies Hydrochlorothiazide  No family history on file.  Social History Social History   Tobacco Use   Smoking status: Never   Smokeless tobacco: Never  Substance Use Topics   Alcohol use: No    Alcohol/week: 0.0 standard drinks    Review of Systems  Review of Systems  Constitutional:  Positive for appetite change and fatigue. Negative for fever.  HENT:  Negative for congestion and sore throat.   Eyes:  Negative for visual disturbance.  Respiratory:  Negative for cough and shortness of breath.   Cardiovascular:  Negative for chest pain.  Gastrointestinal:  Positive for abdominal pain. Negative for diarrhea, nausea and vomiting.  Genitourinary:  Negative for flank pain.  Musculoskeletal:  Negative for back pain and neck pain.  Skin:  Negative for rash and wound.  Neurological:  Negative for weakness.  All other systems reviewed and are negative.   ____________________________________________  PHYSICAL EXAM:      VITAL SIGNS: ED Triage Vitals  Enc Vitals Group     BP 02/08/21 1700 (!) 160/96     Pulse Rate 02/08/21 1700 91     Resp 02/08/21 1700 20     Temp 02/08/21 1700 98.2 F (36.8 C)     Temp Source 02/08/21 1700 Oral     SpO2 02/08/21 1700 95 %     Weight 02/08/21 1702 170 lb (77.1 kg)  Height 02/08/21 1702 5\' 2"  (1.575 m)     Head Circumference --      Peak Flow --      Pain Score 02/08/21 1702 10     Pain Loc --      Pain Edu? --      Excl. in GC? --      Physical Exam Vitals and nursing note reviewed.  Constitutional:      General: She is not in acute distress.    Appearance: She is well-developed.  HENT:     Head: Normocephalic and atraumatic.  Eyes:     Conjunctiva/sclera: Conjunctivae normal.  Cardiovascular:     Rate and Rhythm: Normal rate and regular rhythm.     Heart sounds: Normal heart sounds. No  murmur heard.   No friction rub.  Pulmonary:     Effort: Pulmonary effort is normal. No respiratory distress.     Breath sounds: Normal breath sounds. No wheezing or rales.  Abdominal:     General: There is no distension.     Palpations: Abdomen is soft.     Tenderness: There is abdominal tenderness (mild, epigastric).  Musculoskeletal:     Cervical back: Neck supple.  Skin:    General: Skin is warm.     Capillary Refill: Capillary refill takes less than 2 seconds.  Neurological:     Mental Status: She is alert and oriented to person, place, and time.     Motor: No abnormal muscle tone.      ____________________________________________   LABS (all labs ordered are listed, but only abnormal results are displayed)  Labs Reviewed  COMPREHENSIVE METABOLIC PANEL - Abnormal; Notable for the following components:      Result Value   Glucose, Bld 114 (*)    Total Protein 8.2 (*)    All other components within normal limits  LIPASE, BLOOD - Abnormal; Notable for the following components:   Lipase 53 (*)    All other components within normal limits  CBC WITH DIFFERENTIAL/PLATELET  LACTIC ACID, PLASMA  TROPONIN I (HIGH SENSITIVITY)    ____________________________________________  EKG: Normal sinus rhythm, VR 95. PR 204, QRS 72, QTc 329. No acute ST elevations or depressions. Non specific changes previously seen. ________________________________________  RADIOLOGY All imaging, including plain films, CT scans, and ultrasounds, independently reviewed by me, and interpretations confirmed via formal radiology reads.  ED MD interpretation:   KUB: Colonic constipation CT Angio: No acute abnormality, no evidence of high grade stenosis  Official radiology report(s): DG Abdomen 1 View  Result Date: 02/08/2021 CLINICAL DATA:  Constipation, no bowel movement for 1 week EXAM: ABDOMEN - 1 VIEW COMPARISON:  01/31/2021 FINDINGS: Scattered large and small bowel gas is noted. Mild retained  fecal material is noted consistent with the clinical history. No free air is noted. No acute bony abnormality is seen. Changes of prior cholecystectomy are noted. IMPRESSION: Changes of mild colonic constipation. No obstructive changes are seen. Electronically Signed   By: Alcide CleverMark  Lukens M.D.   On: 02/08/2021 17:51   CT Angio Abd/Pel W and/or Wo Contrast  Result Date: 02/08/2021 CLINICAL DATA:  Suspected mesenteric ischemia. EXAM: CTA ABDOMEN AND PELVIS WITHOUT AND WITH CONTRAST TECHNIQUE: Multidetector CT imaging of the abdomen and pelvis was performed using the standard protocol during bolus administration of intravenous contrast. Multiplanar reconstructed images and MIPs were obtained and reviewed to evaluate the vascular anatomy. CONTRAST:  100mL OMNIPAQUE IOHEXOL 350 MG/ML SOLN COMPARISON:  CT abdomen and pelvis with contrast recently  01/31/2021, older study 10/28/2012 FINDINGS: VASCULAR Aorta: There is heavy calcific atherosclerosis, scant scattered wall thrombus without ulcerative plaque, no critical stenosis, dissection or aneurysm. Celiac: Normal caliber with trace calcifications proximally and early takeoff of the hepatic artery. SMA: Patent without evidence of aneurysm, dissection, vasculitis or significant stenosis. Renals: Both renal arteries are patent without evidence of aneurysm, dissection, vasculitis, fibromuscular dysplasia or significant stenosis. There are nonstenosing calcifications in both proximal renal arteries. IMA: There is a 70% calcific origin stenosis. The remainder of the vessel opacifies normally. Inflow: There patchy calcifications in common iliac and internal iliac arteries, only minimal scattered calcifications in the external iliac arteries. None of the inflow vessels demonstrates a flow-limiting stenosis or aneurysm. Proximal Outflow: Bilateral common femoral and visualized portions of the superficial and profunda femoral arteries are patent without evidence of aneurysm,  dissection, vasculitis or significant stenosis. Veins: There is normal opacification in the portal vein, splenic vein, SMV and IVC, and hepatic veins. The iliac and visualized femoral veins are also patent. Review of the MIP images confirms the above findings. NON-VASCULAR Lower chest: No acute abnormality. There is mild cardiomegaly. Calcific atherosclerosis aortic annulus, right coronary artery. Hepatobiliary: Not well seen due to breathing motion. Left hepatic pneumobilia is chronically noted. No obvious mass. Gallbladder is absent and there is chronic post cholecystectomy extrahepatic biliary prominence as well as mild central intrahepatic biliary prominence, stable. Pancreas: Unremarkable. Spleen: Unremarkable. Adrenals/Urinary Tract: Adrenal glands are unremarkable. Kidneys are normal, without renal calculi, focal lesion, or hydronephrosis. Bladder is unremarkable. Bladder is more distended than previously but does not show wall thickening. Stomach/Bowel: Small hiatal hernia. No GI dilatation or wall thickening including the appendix. No evidence of colitis or diverticulitis. Moderate fecal stasis ascending and transverse colon the mesentery is unremarkable. Lymphatic: No appreciable adenopathy. Reproductive: The uterus is intact. 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 The ovaries are stable in size and configuration. Multiple pelvic phleboliths. Other: There is no free air, hemorrhage or fluid. Musculoskeletal: Osteopenia and degenerative change lumbar spine with degenerative grade 1 L4-5 spondylolisthesis, L4-5 with moderate acquired spinal stenosis. Chronic stable mild compression fracture T11. IMPRESSION: VASCULAR 1. Aortoiliac atherosclerosis. There are less prominent visceral arterial calcific plaques but they are all nonstenosing except for a 70% calcific stenosis in the IMA origin. The SMA and SMV opacified normally. The mesentery is unremarkable. 2. Iliac arterial calcifications  without flow-limiting inflow or proximal outflow arterial stenosis. 3. Mild cardiomegaly.  Atherosclerosis right coronary artery. NON-VASCULAR No appreciable acute findings in the abdomen or pelvis. Chronic changes discussed above. Electronically Signed   By: Telford Nab M.D.   On: 02/08/2021 23:08    ____________________________________________  PROCEDURES   Procedure(s) performed (including Critical Care):  Procedures  ____________________________________________  INITIAL IMPRESSION / MDM / [place] / ED COURSE  As part of my medical decision making, I reviewed the following data within the [facility] notes reviewed and incorporated, Old chart reviewed, Notes from prior ED visits, and [place] Controlled Substance Database       *Rebekah Oliver was evaluated in Emergency Department on 02/09/2021 for the symptoms described in the history of present illness. She was evaluated in the context of the global COVID-19 pandemic, which necessitated consideration that the patient might be at risk for infection with the SARS-CoV-2 virus that causes COVID-19. Institutional protocols and algorithms that pertain to the evaluation of patients at risk for COVID-19 are in a state of rapid change  based on information released by regulatory bodies including the CDC and federal and state organizations. These policies and algorithms were followed during the patient's care in the ED.  Some ED evaluations and interventions may be delayed as a result of limited staffing during the pandemic.*     Medical Decision Making:  75 yo F with PMHx as above here with ongoing abd fullness, burning, and poor appetite. Clinically, suspect ongoing GERD/gastritis, vs constipation w/ ileus. DDx is broad though and includes obstruction, mesenteric ischemia. No CP, recent EKG/trops negative with same pain - doubt ACS. Plan to f/u CT angio and reassess. Labs very reassuring. CBC without leukocytosis  or worsenign anemia. CMP unremarkable, with normal LFTs and renal function. Trop neg here. LA normal.   CT Angio pending. Pt given lactulose, GI cocktail in ED. Plan to hopefully d/c if sx improved after this treatment.  CT angio shows large stool burden, o/w largely unremarkable. Pt does have 70% stenosis of IMA but o/w intact vasculature. No evidence of ischemic changes, remainder of mesentery is wnl, and lactic acid is normal. Do not suspect this is contributing to her sx, but discussed with Dr. Shelia Media given her ongoing pain. He recommends tx of constipation/alternative sources, and outpt follow-up if sx do not improve. This was discussed with pt and daughter who are in agreement. Otherwise, will tx for constipation and d/c home. 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[place])     MEDICATIONS GIVEN DURING THIS VISIT:  Medications  bisacodyl (DULCOLAX) suppository 10 mg (10 mg Rectal Patient Refused/Not Given 02/08/21 2154)  ketorolac (TORADOL) 30 MG/ML injection 15 mg (15 mg Intravenous Patient Refused/Not Given 02/08/21 2301)  alum & mag hydroxide-simeth (MAALOX/MYLANTA) 200-200-20 MG/5ML suspension 30 mL (30 mLs Oral Given 02/08/21 2056)    And  lidocaine (XYLOCAINE) 2 % viscous mouth solution 15 mL (15 mLs Oral Given 02/08/21 2056)  lactulose (CHRONULAC) 10 GM/15ML solution 20 g (20 g Oral Given 02/08/21 2101)  sodium chloride 0.9 % bolus 1,000 mL (0 mLs Intravenous Stopped 02/08/21 2356)  ondansetron (ZOFRAN) injection 4 mg (4 mg Intravenous Given 02/08/21 2056)  famotidine (PEPCID) IVPB 20 mg premix (0 mg Intravenous Stopped 02/08/21 2126)  iohexol (OMNIPAQUE) 350 MG/ML injection 100 mL (100 mLs Intravenous Contrast Given 02/08/21 2202)  morphine 2 MG/ML injection  2 mg (2 mg Intravenous Given 02/08/21 2303)     ED Discharge Orders          Ordered    lactulose (CHRONULAC) 10 GM/15ML solution  2 times daily PRN,   Status:  Discontinued        02/08/21 2203    sucralfate (CARAFATE) 1 g tablet  3 times daily with meals & bedtime,   Status:  Discontinued        02/08/21 2203    famotidine (PEPCID) 20 MG tablet  2 times daily,   Status:  Discontinued        02/08/21 2203    lactulose (CHRONULAC) 10 GM/15ML solution  3 times daily PRN        02/08/21 2250    sucralfate (CARAFATE) 1 g tablet  3 times daily with meals & bedtime        02/08/21 2250    bisacodyl (DULCOLAX) 10 MG suppository  As needed        02/08/21 2250  Note:  This document was prepared using Dragon voice recognition software and may include unintentional dictation errors.   Duffy Bruce, MD 02/09/21 [phone]

## 2021-02-08 NOTE — ED Triage Notes (Signed)
Pt with daughter who states pt has not had a BM in a week and is having abdominal discomfort

## 2021-02-08 NOTE — Discharge Instructions (Addendum)
For your constipation:  - Take lactulose 2-3 times daily until bowel movements are soft. Stop when liquid. - Take the Colace stool softener daily as a preventative  For your abdominal pain:  - Start the carafate tabs with meals - Take Pepcid/famotidine twice a day if symptoms do not improve - You can continue the omperazole as well, prescribed in your prior visit  IF THE ABDOMINAL PAIN DOES NOT IMPROVE, CALL [facility] AT THE NUMBER PROVIDED, FOR FOLLOW-UP IN 2-3 WEEKS  DR. PHARR IS ON CALL TODAY   Do your best to prevent constipation going forward. Take the colace daily and use the Lactulose or other over-the-counter laxative such as MIRALAX if you feel constipated.

## 2021-02-08 NOTE — ED Provider Notes (Signed)
°  Emergency Medicine Provider Triage Evaluation Note  Rebekah Oliver , a 75 y.o.female,  was evaluated in triage.  Pt complains of constipation.  Patient states that she has not had a bowel movement over a week.  She states that she has tried laxatives and they have not worked.  She states that she feels warmth from her chest to her belly as well, however she does not feel feverish.  Denies abdominal pain, shortness of breath, chest pain, or urinary symptoms.   Review of Systems  Positive: Constipation Negative: Denies fever, chest pain, vomiting  Physical Exam   Vitals:   02/08/21 1700  BP: (!) 160/96  Pulse: 91  Resp: 20  Temp: 98.2 F (36.8 C)  SpO2: 95%   Gen:   Awake, no distress   Resp:  Normal effort  MSK:   Moves extremities without difficulty  Other:    Medical Decision Making  Given the patient's initial medical screening exam, the following diagnostic evaluation has been ordered. The patient will be placed in the appropriate treatment space, once one is available, to complete the evaluation and treatment. I have discussed the plan of care with the patient and I have advised the patient that an ED physician or mid-level practitioner will reevaluate their condition after the test results have been received, as the results may give them additional insight into the type of treatment they may need.    Diagnostics: Abdominal x-ray  Treatments: none immediately   Varney Daily, PA 02/08/21 1707    Gilles Chiquito, MD 02/08/21 [phone]

## 2021-02-08 NOTE — ED Notes (Signed)
Pt and daughter refusing labwork.  States "she doesn't have much blood left, since she lost so much weight".  Explained to pts daughter that we will check her blood counts but we will need a sample.  Both refused stating "She will fall asleep if we take any more blood.  She had blood drawn twice last week".  1st nurse notified

## 2021-02-20 ENCOUNTER — Other Ambulatory Visit: Payer: Self-pay

## 2021-02-20 ENCOUNTER — Emergency Department: Payer: Medicare Other

## 2021-02-20 DIAGNOSIS — Z20822 Contact with and (suspected) exposure to covid-19: Secondary | ICD-10-CM | POA: Insufficient documentation

## 2021-02-20 DIAGNOSIS — R531 Weakness: Secondary | ICD-10-CM | POA: Diagnosis not present

## 2021-02-20 DIAGNOSIS — R1013 Epigastric pain: Secondary | ICD-10-CM | POA: Diagnosis not present

## 2021-02-20 DIAGNOSIS — R630 Anorexia: Secondary | ICD-10-CM | POA: Insufficient documentation

## 2021-02-20 DIAGNOSIS — Z5321 Procedure and treatment not carried out due to patient leaving prior to being seen by health care provider: Secondary | ICD-10-CM | POA: Diagnosis not present

## 2021-02-20 LAB — BASIC METABOLIC PANEL
Anion gap: 11 (ref 5–15)
BUN: 8 mg/dL (ref 8–23)
CO2: 21 mmol/L — ABNORMAL LOW (ref 22–32)
Calcium: 9.4 mg/dL (ref 8.9–10.3)
Chloride: 93 mmol/L — ABNORMAL LOW (ref 98–111)
Creatinine, Ser: 0.83 mg/dL (ref 0.44–1.00)
GFR, Estimated: >60 mL/min (ref >60)
Glucose, Bld: 118 mg/dL — ABNORMAL HIGH (ref 70–99)
Potassium: 3.8 mmol/L (ref 3.5–5.1)
Sodium: 125 mmol/L — ABNORMAL LOW (ref 135–145)

## 2021-02-20 LAB — CBC WITH DIFFERENTIAL/PLATELET
Abs Immature Granulocytes: 0.01 10*3/uL (ref 0.00–0.07)
Basophils Absolute: 0 10*3/uL (ref 0.0–0.1)
Basophils Relative: 0 %
Eosinophils Absolute: 0 10*3/uL (ref 0.0–0.5)
Eosinophils Relative: 1 %
HCT: 35.9 % — ABNORMAL LOW (ref 36.0–46.0)
Hemoglobin: 12.3 g/dL (ref 12.0–15.0)
Immature Granulocytes: 0 %
Lymphocytes Relative: 23 %
Lymphs Abs: 1.1 10*3/uL (ref 0.7–4.0)
MCH: 28.7 pg (ref 26.0–34.0)
MCHC: 34.3 g/dL (ref 30.0–36.0)
MCV: 83.7 fL (ref 80.0–100.0)
Monocytes Absolute: 0.6 10*3/uL (ref 0.1–1.0)
Monocytes Relative: 13 %
Neutro Abs: 2.9 10*3/uL (ref 1.7–7.7)
Neutrophils Relative %: 63 %
Platelets: 262 10*3/uL (ref 150–400)
RBC: 4.29 MIL/uL (ref 3.87–5.11)
RDW: 12.4 % (ref 11.5–15.5)
WBC: 4.6 10*3/uL (ref 4.0–10.5)
nRBC: 0 % (ref 0.0–0.2)

## 2021-02-20 LAB — TROPONIN I (HIGH SENSITIVITY): Troponin I (High Sensitivity): 5 ng/L (ref <18)

## 2021-02-20 NOTE — ED Notes (Signed)
Called pt several times no answer  

## 2021-02-20 NOTE — ED Notes (Addendum)
Pt. And daughter refusing EKG, IV start, or any further testing. This RN convinced pt. And daughter to allow minimal blood work with straight stick.

## 2021-02-20 NOTE — ED Triage Notes (Signed)
C/o increasing weakness x3 days with low appetite. Pt. States no fever, vomiting or diarrhea. Denies CP or palpitations. Pt. States she has epigastric pain, is emphatic it is not CP, states she has had similar pain in the past and diagnosed with reflux. Denies painful urination or confusion. Pt. Refused EKG in triage.

## 2021-02-20 NOTE — ED Notes (Signed)
Pt's daughter approached front desk reports she is waiting for her sister to get here  to stay with her mother in the meantime she doe snot want her to have any tests until her sister gets here.

## 2021-02-21 ENCOUNTER — Other Ambulatory Visit: Payer: Self-pay

## 2021-02-21 ENCOUNTER — Emergency Department
Admission: EM | Admit: 2021-02-21 | Discharge: 2021-02-21 | Disposition: A | Discharge status: Home / Self Care | Payer: Medicare Other | Attending: Emergency Medicine | Admitting: Emergency Medicine

## 2021-02-21 DIAGNOSIS — R531 Weakness: Secondary | ICD-10-CM | POA: Diagnosis not present

## 2021-02-21 LAB — URINALYSIS, ROUTINE W REFLEX MICROSCOPIC
Bilirubin Urine: NEGATIVE
Glucose, UA: NEGATIVE mg/dL
Ketones, ur: NEGATIVE mg/dL
Nitrite: NEGATIVE
Protein, ur: NEGATIVE mg/dL
Specific Gravity, Urine: 1.003 — ABNORMAL LOW (ref 1.005–1.030)
pH: 5 (ref 5.0–8.0)

## 2021-02-21 LAB — RESP PANEL BY RT-PCR (FLU A&B, COVID) ARPGX2
Influenza A by PCR: NEGATIVE
Influenza B by PCR: NEGATIVE
SARS Coronavirus 2 by RT PCR: NEGATIVE

## 2021-02-21 LAB — HEPATIC FUNCTION PANEL
ALT: 22 U/L (ref 0–44)
AST: 21 U/L (ref 15–41)
Albumin: 4.4 g/dL (ref 3.5–5.0)
Alkaline Phosphatase: 78 U/L (ref 38–126)
Bilirubin, Direct: <0.1 mg/dL (ref 0.0–0.2)
Total Bilirubin: 0.9 mg/dL (ref 0.3–1.2)
Total Protein: 7.9 g/dL (ref 6.5–8.1)

## 2021-02-21 LAB — TROPONIN I (HIGH SENSITIVITY): Troponin I (High Sensitivity): 6 ng/L (ref <18)

## 2021-02-21 LAB — LIPASE, BLOOD: Lipase: 46 U/L (ref 11–51)

## 2021-03-05 ENCOUNTER — Ambulatory Visit (INDEPENDENT_AMBULATORY_CARE_PROVIDER_SITE_OTHER): Payer: Medicare Other | Admitting: Gastroenterology

## 2021-03-05 ENCOUNTER — Other Ambulatory Visit: Payer: Self-pay

## 2021-03-05 ENCOUNTER — Encounter: Payer: Self-pay | Admitting: Gastroenterology

## 2021-03-05 VITALS — BP 113/77 | HR 85 | Temp 98.7°F | Ht 60.0 in | Wt 122.0 lb

## 2021-03-05 DIAGNOSIS — R1013 Epigastric pain: Secondary | ICD-10-CM

## 2021-03-05 DIAGNOSIS — Z1211 Encounter for screening for malignant neoplasm of colon: Secondary | ICD-10-CM

## 2021-03-05 MED ORDER — NA SULFATE-K SULFATE-MG SULF 17.5-3.13-1.6 GM/177ML PO SOLN: 354.0000 mL | Freq: Once | ORAL | 0 refills | Status: AC

## 2021-03-05 MED ORDER — GOLYTELY 236 G PO SOLR: 4000.0000 mL | Freq: Once | ORAL | 0 refills | Status: AC

## 2021-03-05 NOTE — Progress Notes (Signed)
Arlyss Repress, MD [address]  [address]  [place], [place] [address]  Main: [phone]  Fax: [phone]    Gastroenterology Consultation  Referring Provider:     [facility] Primary Care Physician:  [facility] Primary Gastroenterologist:  Dr. Arlyss Repress Reason for Consultation:     Epigastric burning        HPI:   Rebekah Oliver is a 76 y.o. female referred by [facility], [facility]  for consultation & management of epigastric burning.  Patient speaks [place] and is accompanied by her daughter who speaks [place].  Patient's daughter helped with history taking.  She states that patient has been experiencing 1 month history of burning in her stomach that started in December, had at least 3 ER visits.  Her labs were unrevealing except for mildly elevated lipase.  She underwent CT abdomen pelvis with contrast as well as CT angio which were unrevealing for any acute intra-abdominal pathology.  Patient is given 10 days course of omeprazole 20 mg daily and sucralfate suspension 3 times a day.  Patient developed severe constipation on sucralfate.  She went to urgent care yesterday due to ongoing burning pain in the stomach, was advised to take Nexium.  She is referred her to GI given multiple ER visits.  Patient's daughter states that she has not been eating much that resulted in weight loss recently however she could not quantify how much weight she lost.  Patient denies any abdominal bloating, nausea or vomiting or rectal bleeding.  Patient was given lactulose syrup for constipation.  She is having bowel movements daily.  She underwent cholecystectomy 8 years ago  NSAIDs: None  Antiplts/Anticoagulants/Anti thrombotics: None  GI Procedures: None  History reviewed. No pertinent past medical history.  Past Surgical History:  Procedure Laterality Date   CHOLECYSTECTOMY  2014   sphincterotomy  2014    Current Outpatient  Medications:    lisinopril-hydrochlorothiazide (ZESTORETIC) 20-25 MG tablet, Take 1 tablet by mouth daily., Disp: , Rfl:    Na Sulfate-K Sulfate-Mg Sulf 17.5-3.13-1.6 GM/177ML SOLN, Take 354 mLs by mouth once for 1 dose., Disp: 708 mL, Rfl: 0   sucralfate (CARAFATE) 1 g tablet, Take 1 tablet (1 g total) by mouth 4 (four) times daily -  with meals and at bedtime for 7 days., Disp: 28 tablet, Rfl: 0    History reviewed. No pertinent family history.   Social History   Tobacco Use   Smoking status: Never   Smokeless tobacco: Never  Substance Use Topics   Alcohol use: No    Alcohol/week: 0.0 standard drinks    Allergies as of 03/05/2021 - Review Complete 03/05/2021  Allergen Reaction Noted   Hydrochlorothiazide Itching 02/03/2015    Review of Systems:    All systems reviewed and negative except where noted in HPI.   Physical Exam:  BP 113/77 (BP Location: Left Arm, Patient Position: Sitting, Cuff Size: Normal)    Pulse 85    Temp 98.7 F (37.1 C) (Oral)    Ht 5' (1.524 m)    Wt 122 lb (55.3 kg)    BMI 23.83 kg/m  No LMP recorded. Patient is postmenopausal.  General:   Alert,  Well-developed, well-nourished, pleasant and cooperative in NAD Head:  Normocephalic and atraumatic. Eyes:  Sclera clear, no icterus.   Conjunctiva pink. Ears:  Normal auditory acuity. Nose:  No deformity, discharge, or lesions. Mouth:  No deformity or lesions,oropharynx pink & moist.  Neck:  Supple; no masses or thyromegaly. Lungs:  Respirations even and unlabored.  Clear throughout to auscultation.   No wheezes, crackles, or rhonchi. No acute distress. Heart:  Regular rate and rhythm; no murmurs, clicks, rubs, or gallops. Abdomen:  Normal bowel sounds. Soft, transverse scar in the upper abdomen, non-tender and non-distended without masses, hepatosplenomegaly or hernias noted.  No guarding or rebound tenderness.   Rectal: Not performed Msk:  Symmetrical without gross deformities. Good, equal movement &  strength bilaterally. Pulses:  Normal pulses noted. Extremities:  No clubbing or edema.  No cyanosis. Neurologic:  Alert and oriented x3;  grossly normal neurologically. Skin:  Intact without significant lesions or rashes. No jaundice. Psych:  Alert and cooperative. Normal mood and affect.  Imaging Studies: Reviewed  Assessment and Plan:   Rebekah Oliver is a 76 y.o. [place] female with history of hypertension is seen in consultation for 1 month history of epigastric burning pain associated with abdominal bloating and weight loss  Recommend upper endoscopy for further evaluation Continue Nexium OTC daily  Also, have discussed regarding screening colonoscopy as patient is functionally independent without any major comorbidities and patient's daughter is agreeable  I have discussed alternative options, risks & benefits,  which include, but are not limited to, bleeding, infection, perforation,respiratory complication & drug reaction.  The patient agrees with this plan & written consent will be obtained.      Follow up after endoscopic evaluation   Arlyss Repress, MD

## 2021-03-05 NOTE — Progress Notes (Unsigned)
Insurance does not cover suprep sent Golytely to the pharmacy

## 2021-03-09 ENCOUNTER — Emergency Department
Admission: EM | Admit: 2021-03-09 | Discharge: 2021-03-09 | Disposition: A | Discharge status: Home / Self Care | Payer: Medicare Other | Attending: Emergency Medicine | Admitting: Emergency Medicine

## 2021-03-09 ENCOUNTER — Other Ambulatory Visit: Payer: Self-pay

## 2021-03-09 DIAGNOSIS — R1013 Epigastric pain: Secondary | ICD-10-CM | POA: Diagnosis not present

## 2021-03-09 DIAGNOSIS — G4700 Insomnia, unspecified: Secondary | ICD-10-CM | POA: Insufficient documentation

## 2021-03-09 DIAGNOSIS — I10 Essential (primary) hypertension: Secondary | ICD-10-CM | POA: Diagnosis not present

## 2021-03-09 HISTORY — DX: Essential (primary) hypertension: I10

## 2021-03-09 NOTE — ED Notes (Signed)
Sig pad not working. Pt and family verbalize understanding of d/c instructions. Denies questions or concerns

## 2021-03-09 NOTE — Discharge Instructions (Signed)
You can continue to take the melatonin for sleep.  You can try 25 mg of Benadryl.  Full because this medication in someone her age can make her confused or have falls.  She should not be getting up and walking around after taking it.  Please follow-up with your primary care provider if she continues to have difficulty sleeping.

## 2021-03-09 NOTE — ED Notes (Signed)
Pt refuses to have blood drawn at this time. Pt states she just wants something to sleep.

## 2021-03-09 NOTE — ED Triage Notes (Signed)
Pt comes with c/o severe fatigue and weakness. Pt states she hasn't been able to sleep for couple weeks. Pt denies any CP or SOB>

## 2021-03-09 NOTE — ED Provider Notes (Signed)
[facility] Provider Note    Event Date/Time   First MD Initiated Contact with Patient 03/09/21 1531     (approximate)   History   Weakness   HPI  Rebekah Oliver is a 76 y.o. female with past medical history of hypertension who presents with insomnia.  Patient presents with her daughter.  She speaks [place] and her daughter interprets for her.  Patient has not been able to sleep for several months.  She tries to sleep but then feels awake and is up almost all night.  She has not been napping during the day.  Does feel fatigued.  She denies being kept up by the epigastric pain that she is currently experiencing and following with GI 4.  She denies significant anxiety or depression.  They have been trying melatonin up to 12 mg which is not helping.  She is not using caffeine.  Patient has had ongoing epigastric discomfort and some weight loss for which she is seeing GI for and has been seen in the ED for as well.  This is not changed today.  She denies fevers chills chest pain or shortness of breath.    Past Medical History:  Diagnosis Date   Hypertension     Patient Active Problem List   Diagnosis Date Noted   Hypertension 02/03/2015     Physical Exam  Triage Vital Signs: ED Triage Vitals  Enc Vitals Group     BP 03/09/21 1125 (!) 142/81     Pulse Rate 03/09/21 1125 95     Resp 03/09/21 1125 19     Temp 03/09/21 1125 97.9 F (36.6 C)     Temp Source 03/09/21 1125 Oral     SpO2 03/09/21 1125 93 %     Weight --      Height --      Head Circumference --      Peak Flow --      Pain Score 03/09/21 1126 0     Pain Loc --      Pain Edu? --      Excl. in GC? --     Most recent vital signs: Vitals:   03/09/21 1536 03/09/21 1537  BP: (!) 160/101   Pulse: 81 81  Resp: 16   Temp:    SpO2: 98% 98%     General: Awake, no distress.  CV:  Good peripheral perfusion.  Resp:  Normal effort.  Abd:  No distention.  Neuro:             Awake, Alert,  Oriented x 3  Other:     ED Results / Procedures / Treatments  Labs (all labs ordered are listed, but only abnormal results are displayed) Labs Reviewed  BASIC METABOLIC PANEL  CBC  URINALYSIS, ROUTINE W REFLEX MICROSCOPIC  CBG MONITORING, ED     EKG  EKG interpretation performed by myself: NSR, nml axis, nml intervals, no acute ischemic changes    RADIOLOGY    PROCEDURES:    MEDICATIONS ORDERED IN ED: Medications - No data to display   IMPRESSION / MDM / ASSESSMENT AND PLAN / ED COURSE  I reviewed the triage vital signs and the nursing notes.                              Differential diagnosis includes, but is not limited to, primary insomnia, anxiety, medication side effect  76 year old female presents with  insomnia times several months.  Patient is accompanied by by her daughter who provides an independent history and also interprets for the patient.  She has otherwise been dealing with chronic epigastric pain for which she is followed with GI but this is unchanged today.  She has no other symptoms of anxiety chest pain shortness of breath.  The pain is not keeping her up.  No new medications not taking caffeine and she has no anxiety depression.  Patient's vital signs are notable for mild hypertension.  Patient refused blood work.  On exam she appears well not in any distress.  They have been trying melatonin 12 mg.  Main reason for them coming here today is because they would like something stronger for sleep.  I discussed with the patient and her daughter that benzodiazepines or medicines similar are not safe in her age group and that I would not be prescribing that today.  I did discuss that they may try over-the-counter Benadryl but I also counseled them on the risks of oversedation confusion urinary retention etc. and that patient should not be getting out of bed after she is taking this medication.  I did review the patient's EKG which is nonischemic.  Given she has  no other acute symptoms no further work-up is needed today.  I did discuss that if this is an ongoing issue she should follow-up with the primary care provider.   FINAL CLINICAL IMPRESSION(S) / ED DIAGNOSES   Final diagnoses:  Insomnia, unspecified type     Rx / DC Orders   ED Discharge Orders     None        Note:  This document was prepared using Dragon voice recognition software and may include unintentional dictation errors.   Georga Hacking, MD 03/09/21 [phone]

## 2021-03-09 NOTE — ED Notes (Addendum)
See triage note, pt here with family. States just wants meds to help sleep, current meds are not working anymore. NAD noted. Ambulatory with steady gait Declines other needs

## 2021-03-10 ENCOUNTER — Ambulatory Visit: Payer: Medicare Other | Admitting: Certified Registered"

## 2021-03-10 ENCOUNTER — Other Ambulatory Visit: Payer: Self-pay

## 2021-03-10 ENCOUNTER — Ambulatory Visit
Admission: RE | Admit: 2021-03-10 | Discharge: 2021-03-10 | Disposition: A | Discharge status: Home / Self Care | Payer: Medicare Other | Attending: Gastroenterology | Admitting: Gastroenterology

## 2021-03-10 ENCOUNTER — Encounter
Admission: RE | Disposition: A | Discharge status: Home / Self Care | Payer: Self-pay | Source: Home / Self Care | Attending: Gastroenterology

## 2021-03-10 ENCOUNTER — Encounter: Payer: Self-pay | Admitting: Gastroenterology

## 2021-03-10 DIAGNOSIS — R1013 Epigastric pain: Secondary | ICD-10-CM

## 2021-03-10 DIAGNOSIS — K3189 Other diseases of stomach and duodenum: Secondary | ICD-10-CM | POA: Diagnosis not present

## 2021-03-10 DIAGNOSIS — I10 Essential (primary) hypertension: Secondary | ICD-10-CM | POA: Diagnosis not present

## 2021-03-10 DIAGNOSIS — R634 Abnormal weight loss: Secondary | ICD-10-CM | POA: Diagnosis not present

## 2021-03-10 DIAGNOSIS — K449 Diaphragmatic hernia without obstruction or gangrene: Secondary | ICD-10-CM | POA: Insufficient documentation

## 2021-03-10 DIAGNOSIS — Z1211 Encounter for screening for malignant neoplasm of colon: Secondary | ICD-10-CM | POA: Diagnosis present

## 2021-03-10 HISTORY — PX: COLONOSCOPY WITH PROPOFOL: SHX5780

## 2021-03-10 HISTORY — PX: ESOPHAGOGASTRODUODENOSCOPY (EGD) WITH PROPOFOL: SHX5813

## 2021-03-10 SURGERY — COLONOSCOPY WITH PROPOFOL
Anesthesia: General

## 2021-03-10 MED ORDER — PROPOFOL 500 MG/50ML IV EMUL
INTRAVENOUS | Status: DC | PRN
  Administered 2021-03-10: 140 ug/kg/min via INTRAVENOUS

## 2021-03-10 MED ORDER — ESMOLOL HCL 100 MG/10ML IV SOLN
INTRAVENOUS | Status: DC | PRN
Start: 2021-03-10 — End: 2021-03-10
  Administered 2021-03-10 (×2): 10 mg via INTRAVENOUS

## 2021-03-10 MED ORDER — SODIUM CHLORIDE 0.9 % IV SOLN: INTRAVENOUS | Status: DC

## 2021-03-10 MED ORDER — PROPOFOL 10 MG/ML IV BOLUS
INTRAVENOUS | Status: DC | PRN
Start: 2021-03-10 — End: 2021-03-10
  Administered 2021-03-10: 50 mg via INTRAVENOUS

## 2021-03-10 MED ORDER — GLYCOPYRROLATE 0.2 MG/ML IJ SOLN
INTRAMUSCULAR | Status: DC | PRN
  Administered 2021-03-10: .2 mg via INTRAVENOUS

## 2021-03-10 MED ORDER — LIDOCAINE HCL (CARDIAC) PF 100 MG/5ML IV SOSY
PREFILLED_SYRINGE | INTRAVENOUS | Status: DC | PRN
  Administered 2021-03-10: 100 mg via INTRAVENOUS

## 2021-03-10 NOTE — Transfer of Care (Signed)
Immediate Anesthesia Transfer of Care Note  Patient: Rebekah Oliver  Procedure(s) Performed: COLONOSCOPY WITH PROPOFOL ESOPHAGOGASTRODUODENOSCOPY (EGD) WITH PROPOFOL  Patient Location: Endoscopy Unit  Anesthesia Type:General  Level of Consciousness: drowsy and patient cooperative  Airway & Oxygen Therapy: Patient Spontanous Breathing and Patient connected to face mask oxygen  Post-op Assessment: Report given to RN and Post -op Vital signs reviewed and stable  Post vital signs: Reviewed and stable  Last Vitals:  Vitals Value Taken Time  BP 82/66 03/10/21 1220  Temp 36.1 C 03/10/21 1220  Pulse 93 03/10/21 1220  Resp 19 03/10/21 1220  SpO2 99 % 03/10/21 1220    Last Pain:  Vitals:   03/10/21 1220  TempSrc: Temporal  PainSc: Asleep         Complications: No notable events documented.

## 2021-03-10 NOTE — Anesthesia Preprocedure Evaluation (Signed)
Anesthesia Evaluation  Patient identified by MRN, date of birth, ID band Patient awake    Reviewed: Allergy & Precautions, H&P , NPO status , Patient's Chart, lab work & pertinent test results, reviewed documented beta blocker date and time   History of Anesthesia Complications Negative for: history of anesthetic complications  Airway Mallampati: II  TM Distance: >3 FB Neck ROM: full    Dental  (+) Dental Advidsory Given   Pulmonary neg pulmonary ROS,    Pulmonary exam normal breath sounds clear to auscultation       Cardiovascular Exercise Tolerance: Good hypertension, (-) angina(-) Past MI and (-) Cardiac Stents Normal cardiovascular exam(-) dysrhythmias (-) Valvular Problems/Murmurs Rhythm:regular Rate:Normal     Neuro/Psych negative neurological ROS  negative psych ROS   GI/Hepatic negative GI ROS, Neg liver ROS,   Endo/Other  negative endocrine ROS  Renal/GU negative Renal ROS  negative genitourinary   Musculoskeletal   Abdominal   Peds  Hematology negative hematology ROS (+)   Anesthesia Other Findings Past Medical History: No date: Hypertension   Reproductive/Obstetrics negative OB ROS                             Anesthesia Physical Anesthesia Plan  ASA: 2  Anesthesia Plan: General   Post-op Pain Management:    Induction: Intravenous  PONV Risk Score and Plan: 3 and Propofol infusion and TIVA  Airway Management Planned: Natural Airway and Nasal Cannula  Additional Equipment:   Intra-op Plan:   Post-operative Plan:   Informed Consent: I have reviewed the patients History and Physical, chart, labs and discussed the procedure including the risks, benefits and alternatives for the proposed anesthesia with the patient or authorized representative who has indicated his/her understanding and acceptance.     Dental Advisory Given  Plan Discussed with: Anesthesiologist,  CRNA and Surgeon  Anesthesia Plan Comments:         Anesthesia Quick Evaluation

## 2021-03-10 NOTE — Op Note (Signed)
[facility] Gastroenterology Patient Name: Rebekah Oliver Procedure Date: 03/10/2021 11:40 AM MRN: [id number] Account #: [id number] Date of Birth: [birth year] Admit Type: Outpatient Age: 76 Room: [facility] ENDO ROOM 3 Gender: Female Note Status: Finalized Instrument Name: Upper Endoscope [id number] Procedure:             Upper GI endoscopy Indications:           Epigastric abdominal pain, Weight loss Providers:             Lin Landsman MD, MD Medicines:             General Anesthesia Complications:         No immediate complications. Estimated blood loss: None. Procedure:             Pre-Anesthesia Assessment:                        - Prior to the procedure, a History and Physical was                         performed, and patient medications and allergies were                         reviewed. The patient is competent. The risks and                         benefits of the procedure and the sedation options and                         risks were discussed with the patient. All questions                         were answered and informed consent was obtained.                         Patient identification and proposed procedure were                         verified by the physician, the nurse, the                         anesthesiologist, the anesthetist and the technician                         in the pre-procedure area in the procedure room in the                         endoscopy suite. Mental Status Examination: alert and                         oriented. Airway Examination: normal oropharyngeal                         airway and neck mobility. Respiratory Examination:                         clear to auscultation. CV Examination: normal.  Prophylactic Antibiotics: The patient does not require                         prophylactic antibiotics. Prior Anticoagulants: The                         patient has taken no previous anticoagulant or                          antiplatelet agents. ASA Grade Assessment: II - A                         patient with mild systemic disease. After reviewing                         the risks and benefits, the patient was deemed in                         satisfactory condition to undergo the procedure. The                         anesthesia plan was to use general anesthesia.                         Immediately prior to administration of medications,                         the patient was re-assessed for adequacy to receive                         sedatives. The heart rate, respiratory rate, oxygen                         saturations, blood pressure, adequacy of pulmonary                         ventilation, and response to care were monitored                         throughout the procedure. The physical status of the                         patient was re-assessed after the procedure.                        After obtaining informed consent, the endoscope was                         passed under direct vision. Throughout the procedure,                         the patient's blood pressure, pulse, and oxygen                         saturations were monitored continuously. The Endoscope                         was introduced through the mouth, and advanced to the  second part of duodenum. The upper GI endoscopy was                         accomplished without difficulty. The patient tolerated                         the procedure well. Findings:      Scattered hyperpigmneted patches of mucosal changes characterized by       discoloration were found in the second portion of the duodenum. Biopsies       were taken with a cold forceps for histology.      The duodenal bulb was normal.      The entire examined stomach was normal. Biopsies were taken with a cold       forceps for Helicobacter pylori testing.      A small hiatal hernia was present.      Esophagogastric landmarks were  identified: the gastroesophageal junction       was found at 35 cm from the incisors.      The gastroesophageal junction and examined esophagus were normal. Impression:            - Mucosal changes in the duodenum. Biopsied.                        - Normal duodenal bulb.                        - Normal stomach. Biopsied.                        - Small hiatal hernia.                        - Esophagogastric landmarks identified.                        - Normal gastroesophageal junction and esophagus. Recommendation:        - Await pathology results.                        - Proceed with colonoscopy as scheduled                        See colonoscopy report                        - Follow an antireflux regimen.                        - Use Prilosec (omeprazole) 40 mg PO daily for 1 month.                        - Return to my office as previously scheduled. Procedure Code(s):     --- Professional ---                        [phone], Esophagogastroduodenoscopy, flexible,                         transoral; with biopsy, single or multiple Diagnosis Code(s):     --- Professional ---  K31.89, Other diseases of stomach and duodenum                        K44.9, Diaphragmatic hernia without obstruction or                         gangrene                        R10.13, Epigastric pain                        R63.4, Abnormal weight loss CPT copyright 2019 [employer]. All rights reserved. The codes documented in this report are preliminary and upon coder review may  be revised to meet current compliance requirements. Dr. Ulyess Mort Lin Landsman MD, MD 03/10/2021 12:04:32 PM This report has been signed electronically. Number of Addenda: 0 Note Initiated On: 03/10/2021 11:40 AM Estimated Blood Loss:  Estimated blood loss: none.      [facility]

## 2021-03-10 NOTE — Op Note (Signed)
[facility] Gastroenterology Patient Name: Rebekah Oliver Procedure Date: 03/10/2021 11:37 AM MRN: [id number] Account #: [id number] Date of Birth: January 21, 1946 Admit Type: Outpatient Age: 76 Room: [facility] ENDO ROOM 3 Gender: Female Note Status: Finalized Instrument Name: Peds Colonoscope [id number] Procedure:             Colonoscopy Indications:           Screening for colorectal malignant neoplasm, This is                         the patient's first colonoscopy Providers:             Lin Landsman MD, MD Medicines:             General Anesthesia Complications:         No immediate complications. Estimated blood loss: None. Procedure:             Pre-Anesthesia Assessment:                        - Prior to the procedure, a History and Physical was                         performed, and patient medications and allergies were                         reviewed. The patient is competent. The risks and                         benefits of the procedure and the sedation options and                         risks were discussed with the patient. All questions                         were answered and informed consent was obtained.                         Patient identification and proposed procedure were                         verified by the physician, the nurse, the                         anesthesiologist, the anesthetist and the technician                         in the pre-procedure area in the procedure room in the                         endoscopy suite. Mental Status Examination: alert and                         oriented. Airway Examination: normal oropharyngeal                         airway and neck mobility. Respiratory Examination:                         clear to  auscultation. CV Examination: normal.                         Prophylactic Antibiotics: The patient does not require                         prophylactic antibiotics. Prior Anticoagulants: The                          patient has taken no previous anticoagulant or                         antiplatelet agents. ASA Grade Assessment: II - A                         patient with mild systemic disease. After reviewing                         the risks and benefits, the patient was deemed in                         satisfactory condition to undergo the procedure. The                         anesthesia plan was to use general anesthesia.                         Immediately prior to administration of medications,                         the patient was re-assessed for adequacy to receive                         sedatives. The heart rate, respiratory rate, oxygen                         saturations, blood pressure, adequacy of pulmonary                         ventilation, and response to care were monitored                         throughout the procedure. The physical status of the                         patient was re-assessed after the procedure.                        After obtaining informed consent, the colonoscope was                         passed under direct vision. Throughout the procedure,                         the patient's blood pressure, pulse, and oxygen                         saturations were monitored continuously. The  Colonoscope was introduced through the anus and                         advanced to the the cecum, identified by appendiceal                         orifice and ileocecal valve. The colonoscopy was                         performed without difficulty. The patient tolerated                         the procedure well. The quality of the bowel                         preparation was poor. Findings:      The perianal and digital rectal examinations were normal. Pertinent       negatives include normal sphincter tone and no palpable rectal lesions.      Copious quantities of semi-liquid stool was found in the entire colon,       precluding  visualization.      No evidence of cancer or large polyps found. Flat and small polyps could       be missed      The retroflexed view of the distal rectum and anal verge was normal and       showed no anal or rectal abnormalities. Impression:            - Preparation of the colon was poor.                        - Stool in the entire examined colon.                        - The distal rectum and anal verge are normal on                         retroflexion view.                        - No specimens collected. Recommendation:        - Discharge patient to home (with escort).                        - Resume previous diet today.                        - Continue present medications.                        - Return to my office as previously scheduled. Procedure Code(s):     --- Professional ---                        [id number], Colorectal cancer screening; colonoscopy on                         individual not meeting criteria for high risk Diagnosis Code(s):     --- Professional ---  Z12.11, Encounter for screening for malignant neoplasm                         of colon CPT copyright 2019 American Medical Association. All rights reserved. The codes documented in this report are preliminary and upon coder review may  be revised to meet current compliance requirements. Dr. Ulyess Mort Lin Landsman MD, MD 03/10/2021 12:18:28 PM This report has been signed electronically. Number of Addenda: 0 Note Initiated On: 03/10/2021 11:37 AM Scope Withdrawal Time: 0 hours 7 minutes 17 seconds  Total Procedure Duration: 0 hours 10 minutes 3 seconds  Estimated Blood Loss:  Estimated blood loss: none.      [facility]

## 2021-03-10 NOTE — Anesthesia Procedure Notes (Signed)
Procedure Name: General with mask airway Date/Time: 03/10/2021 11:57 AM Performed by: Mohammed Kindle, CRNA Pre-anesthesia Checklist: Patient identified, Emergency Drugs available, Suction available and Patient being monitored Patient Re-evaluated:Patient Re-evaluated prior to induction Oxygen Delivery Method: Simple face mask Induction Type: IV induction Placement Confirmation: positive ETCO2 and CO2 detector Dental Injury: Teeth and Oropharynx as per pre-operative assessment

## 2021-03-10 NOTE — H&P (Signed)
°  Arlyss Repress, MD [address]  [address]  [place], [place] [address]  Main: [phone]  Fax: [phone] Pager: [phone]  Primary Care Physician:  [facility] Primary Gastroenterologist:  Dr. Arlyss Repress  Pre-Procedure History & Physical: HPI:  Rebekah Oliver is a 76 y.o. female is here for an endoscopy and colonoscopy.   Past Medical History:  Diagnosis Date   Hypertension     Past Surgical History:  Procedure Laterality Date   CHOLECYSTECTOMY  2014   sphincterotomy  2014    Prior to Admission medications   Medication Sig Start Date End Date Taking? Authorizing Provider  lisinopril-hydrochlorothiazide (ZESTORETIC) 20-25 MG tablet Take 1 tablet by mouth daily. 02/17/21  Yes [provider]  sucralfate (CARAFATE) 1 g tablet Take 1 tablet (1 g total) by mouth 4 (four) times daily -  with meals and at bedtime for 7 days. 02/08/21 02/15/21  Shaune Pollack, MD    Allergies as of 03/05/2021 - Review Complete 03/05/2021  Allergen Reaction Noted   Hydrochlorothiazide Itching 02/03/2015    History reviewed. No pertinent family history.  Social History   Socioeconomic History   Marital status: Single    Spouse name: Not on file   Number of children: Not on file   Years of education: Not on file   Highest education level: Not on file  Occupational History   Not on file  Tobacco Use   Smoking status: Never   Smokeless tobacco: Never  Vaping Use   Vaping Use: Never used  Substance and Sexual Activity   Alcohol use: No    Alcohol/week: 0.0 standard drinks   Drug use: Never   Sexual activity: Not on file  Other Topics Concern   Not on file  Social History Narrative   Not on file   Social Determinants of Health   Financial Resource Strain: Not on file  Food Insecurity: Not on file  Transportation Needs: Not on file  Physical Activity: Not on file  Stress: Not on file  Social Connections: Not on file  Intimate  Partner Violence: Not on file    Review of Systems: See HPI, otherwise negative ROS  Physical Exam: BP (!) 146/85    Pulse 100    Temp 97.6 F (36.4 C) (Temporal)    Resp 20    Ht 5\' 1"  (1.549 m)    Wt 57.2 kg    SpO2 97%    BMI 23.81 kg/m  General:   Alert,  pleasant and cooperative in NAD Head:  Normocephalic and atraumatic. Neck:  Supple; no masses or thyromegaly. Lungs:  Clear throughout to auscultation.    Heart:  Regular rate and rhythm. Abdomen:  Soft, nontender and nondistended. Normal bowel sounds, without guarding, and without rebound.   Neurologic:  Alert and  oriented x4;  grossly normal neurologically.  Impression/Plan: Rebekah Oliver is here for an endoscopy and colonoscopy to be performed for epigastric pain, colon cancer screening  Risks, benefits, limitations, and alternatives regarding  endoscopy and colonoscopy have been reviewed with the patient.  Questions have been answered.  All parties agreeable.   Creed Copper, MD  03/10/2021, 11:45 AM

## 2021-03-11 ENCOUNTER — Encounter: Payer: Self-pay | Admitting: Gastroenterology

## 2021-03-11 ENCOUNTER — Telehealth: Payer: Self-pay

## 2021-03-11 LAB — SURGICAL PATHOLOGY

## 2021-03-11 NOTE — Telephone Encounter (Signed)
Patient daughter is calling to schedule patient mom colonoscopy. She had it done on 03/10/21 but was not clean out.

## 2021-03-11 NOTE — Telephone Encounter (Signed)
Dr. Vanga's patient 

## 2021-03-12 NOTE — Telephone Encounter (Signed)
-----   Message from Toney Reil, MD sent at 03/11/2021  4:24 PM EST ----- Please inform patient's daughter that the pathology results from upper endoscopy came back normal.  Given the presence of hiatal hernia, I recommend her to continue Nexium 20 mg daily before breakfast for 1 month, then stop  Will see her for follow-up as scheduled  Rohini Vanga

## 2021-03-12 NOTE — Telephone Encounter (Signed)
Patient daughter verbalized understanding and made follow up appointment

## 2021-03-16 ENCOUNTER — Ambulatory Visit: Payer: Medicare Other | Admitting: Gastroenterology

## 2021-03-16 ENCOUNTER — Telehealth: Payer: Self-pay

## 2021-03-16 MED ORDER — FAMOTIDINE 20 MG PO TABS: 20.0000 mg | ORAL_TABLET | Freq: Every day | ORAL | 0 refills | Status: DC

## 2021-03-16 NOTE — Telephone Encounter (Signed)
Patient daughter is calling because she states when her mom takes the Nexium she feels hot, flush, and her gerd symptoms are worse. She thinks her mom needs a antibiotic.

## 2021-03-16 NOTE — Telephone Encounter (Signed)
Let's switch to Pepcid 20 mg daily.  I do not recommend any antibiotic She probably also has constipation, advise MiraLAX daily or every other day  RV

## 2021-03-16 NOTE — Anesthesia Postprocedure Evaluation (Signed)
Anesthesia Post Note  Patient: Cicley Charter  Procedure(s) Performed: COLONOSCOPY WITH PROPOFOL ESOPHAGOGASTRODUODENOSCOPY (EGD) WITH PROPOFOL  Patient location during evaluation: Endoscopy Anesthesia Type: General Level of consciousness: awake and alert Pain management: pain level controlled Vital Signs Assessment: post-procedure vital signs reviewed and stable Respiratory status: spontaneous breathing, nonlabored ventilation, respiratory function stable and patient connected to nasal cannula oxygen Cardiovascular status: blood pressure returned to baseline and stable Postop Assessment: no apparent nausea or vomiting Anesthetic complications: no   No notable events documented.   Last Vitals:  Vitals:   03/10/21 1240 03/10/21 1250  BP: (!) 149/89 (!) 145/88  Pulse: 90 81  Resp: 18 18  Temp:    SpO2: 100% 98%    Last Pain:  Vitals:   03/10/21 1250  TempSrc:   PainSc: 0-No pain                 Lenard Simmer

## 2021-03-16 NOTE — Addendum Note (Signed)
Addended by: Ulyess Blossom L on: 03/16/2021 04:28 PM   Modules accepted: Orders

## 2021-03-16 NOTE — Telephone Encounter (Signed)
Patient daughter verbalized understanding  

## 2021-03-24 ENCOUNTER — Telehealth: Payer: Self-pay

## 2021-03-24 MED ORDER — DEXLANSOPRAZOLE 60 MG PO CPDR: 60.0000 mg | DELAYED_RELEASE_CAPSULE | Freq: Every day | ORAL | 3 refills | Status: DC

## 2021-03-24 NOTE — Telephone Encounter (Signed)
Patient daughter verbalized understanding of instructions and sent medication to pharmacy. We do not have samples of medication

## 2021-03-24 NOTE — Telephone Encounter (Signed)
Patient and patient daughter came in this morning because the patient is having epigastric burning that will not stop. She is having the burning and pain that radiates up to her chest and throat. She is having some shortness of breath. Daughter states she is taking omeprazole 20mg  but medication list says Pepcid 20mg . She has taken pantoprazole in the past daughter states. They wanted her seen right now. Informed patient and patient daughter the Dr. was in procedures and would not be back till this afternoon. Advised patient if the pain was bad she needed to go to the ER or urgent care. Daughter states they will wait to see what Dr. recommends

## 2021-03-24 NOTE — Addendum Note (Signed)
Addended by: Radene Knee L on: 03/24/2021 09:50 AM   Modules accepted: Orders

## 2021-03-24 NOTE — Telephone Encounter (Signed)
Let's try dexilant 60mg  daily, give her some samples  RV

## 2021-03-24 NOTE — Telephone Encounter (Signed)
Submitted PA for Dexilant on cover my meds for medication. Attached last office visit note to the PA

## 2021-04-02 ENCOUNTER — Encounter (INDEPENDENT_AMBULATORY_CARE_PROVIDER_SITE_OTHER): Payer: Self-pay | Admitting: Vascular Surgery

## 2021-04-13 ENCOUNTER — Other Ambulatory Visit: Payer: Self-pay

## 2021-04-13 ENCOUNTER — Telehealth: Payer: Self-pay | Admitting: Gastroenterology

## 2021-04-13 ENCOUNTER — Encounter: Payer: Self-pay | Admitting: Gastroenterology

## 2021-04-13 ENCOUNTER — Ambulatory Visit (INDEPENDENT_AMBULATORY_CARE_PROVIDER_SITE_OTHER): Payer: Medicare Other | Admitting: Gastroenterology

## 2021-04-13 VITALS — BP 132/77 | HR 85 | Temp 97.6°F | Ht 60.0 in | Wt 119.2 lb

## 2021-04-13 DIAGNOSIS — G8929 Other chronic pain: Secondary | ICD-10-CM

## 2021-04-13 DIAGNOSIS — R630 Anorexia: Secondary | ICD-10-CM

## 2021-04-13 DIAGNOSIS — R1013 Epigastric pain: Secondary | ICD-10-CM

## 2021-04-13 MED ORDER — OMEPRAZOLE 40 MG PO CPDR: 40.0000 mg | DELAYED_RELEASE_CAPSULE | Freq: Two times a day (BID) | ORAL | 0 refills | Status: AC

## 2021-04-13 MED ORDER — OLANZAPINE 2.5 MG PO TABS: 2.5000 mg | ORAL_TABLET | Freq: Every day | ORAL | 0 refills | Status: DC

## 2021-04-13 NOTE — Telephone Encounter (Signed)
Daughter was wanting to know if the Zyprexa was for appetite informed patient daughter it was and she verbalized understanding

## 2021-04-13 NOTE — Patient Instructions (Signed)
Gastric emptying study is 04/24/2021 at 8:30[place] [facility] Nothing to eat or drink after midnight. Do not take omeprazole the morning of scan

## 2021-04-13 NOTE — Progress Notes (Signed)
Arlyss Repress, MD [address]  [address]  [place] [employer] [place], [place] [address]  Main: [phone]  Fax: [phone]    Gastroenterology Consultation  Referring Provider:     [facility], [facility]* Primary Care Physician:  [facility] Primary Gastroenterologist:  Dr. Arlyss Repress Reason for Consultation:     Epigastric burning        HPI:   Rebekah Oliver is a 76 y.o. female referred by [facility]  for consultation & management of epigastric burning.  Patient speaks [place] and is accompanied by her daughter who speaks [place].  Patient's daughter helped with history taking.  She states that patient has been experiencing 1 month history of burning in her stomach that started in December, had at least 3 ER visits.  Her labs were unrevealing except for mildly elevated lipase.  She underwent CT abdomen pelvis with contrast as well as CT angio which were unrevealing for any acute intra-abdominal pathology.  Patient is given 10 days course of omeprazole 20 mg daily and sucralfate suspension 3 times a day.  Patient developed severe constipation on sucralfate.  She went to urgent care yesterday due to ongoing burning pain in the stomach, was advised to take Nexium.  She is referred her to GI given multiple ER visits.  Patient's daughter states that she has not been eating much that resulted in weight loss recently however she could not quantify how much weight she lost.  Patient denies any abdominal bloating, nausea or vomiting or rectal bleeding.  Patient was given lactulose syrup for constipation.  She is having bowel movements daily.  She underwent cholecystectomy 8 years ago  Follow-up visit 04/14/2021 Patient is here for follow-up of ongoing epigastric burning which starts in the substernal area, spreading across right and left upper quadrants.  She reports that the pain is more or less constant, omeprazole temporarily helps.  Food makes  it worse.  She is currently on omeprazole 40 mg once a day.  Her upper endoscopy was unremarkable.  She lost about 2 to 3 pounds since last visit.  She also reports irregular bowel habits.  Her colonoscopy did not reveal any malignancy.  NSAIDs: None  Antiplts/Anticoagulants/Anti thrombotics: None  GI Procedures:  Upper endoscopy 03/10/2021 - Mucosal changes in the duodenum. Biopsied. - Normal duodenal bulb. - Normal stomach. Biopsied. - Small hiatal hernia. - Esophagogastric landmarks identified. - Normal gastroesophageal junction and esophagus.  Colonoscopy 03/10/2021 - Preparation of the colon was poor. - Stool in the entire examined colon. - The distal rectum and anal verge are normal on retroflexion view. - No specimens collected.  DIAGNOSIS:  A. DUODENUM; COLD BIOPSY:  - DUODENAL MUCOSA WITH NO SIGNIFICANT PATHOLOGIC ALTERATION.  - NEGATIVE FOR FEATURES OF CELIAC DISEASE.  - NEGATIVE FOR DYSPLASIA AND MALIGNANCY.   B. STOMACH; COLD BIOPSY:  - ANTRAL AND OXYNTIC MUCOSA WITH MINIMAL CHRONIC INFLAMMATION.  - NEGATIVE FOR ACTIVE INFLAMMATION AND H PYLORI.  - NEGATIVE FOR INTESTINAL METAPLASIA, DYSPLASIA, AND MALIGNANCY.   Past Medical History:  Diagnosis Date   Hypertension     Past Surgical History:  Procedure Laterality Date   CHOLECYSTECTOMY  2014   COLONOSCOPY WITH PROPOFOL N/A 03/10/2021   Procedure: COLONOSCOPY WITH PROPOFOL;  Surgeon: Toney Reil, MD;  Location: [facility] ENDOSCOPY;  Service: Gastroenterology;  Laterality: N/A;   ESOPHAGOGASTRODUODENOSCOPY (EGD) WITH PROPOFOL N/A 03/10/2021   Procedure: ESOPHAGOGASTRODUODENOSCOPY (EGD) WITH PROPOFOL;  Surgeon: Toney Reil, MD;  Location:  [facility] ENDOSCOPY;  Service: Gastroenterology;  Laterality: N/A;   sphincterotomy  2014    Current Outpatient Medications:    amLODipine (NORVASC) 5 MG tablet, Take by mouth., Disp: , Rfl:    FLUoxetine (PROZAC) 10 MG capsule, Take by mouth., Disp: , Rfl:    hydrOXYzine  (ATARAX) 10 MG tablet, Take 10 mg by mouth at bedtime., Disp: , Rfl:    lisinopril-hydrochlorothiazide (ZESTORETIC) 20-25 MG tablet, Take 1 tablet by mouth daily., Disp: , Rfl:    OLANZapine (ZYPREXA) 2.5 MG tablet, Take 1 tablet (2.5 mg total) by mouth at bedtime., Disp: 14 tablet, Rfl: 0   traZODone (DESYREL) 50 MG tablet, Take by mouth., Disp: , Rfl:    omeprazole (PRILOSEC) 40 MG capsule, Take 1 capsule (40 mg total) by mouth 2 (two) times daily before a meal., Disp: 60 capsule, Rfl: 0    No family history on file.   Social History   Tobacco Use   Smoking status: Never   Smokeless tobacco: Never  Vaping Use   Vaping Use: Never used  Substance Use Topics   Alcohol use: No    Alcohol/week: 0.0 standard drinks   Drug use: Never    Allergies as of 04/13/2021 - Review Complete 04/13/2021  Allergen Reaction Noted   Hydrochlorothiazide Itching 02/03/2015    Review of Systems:    All systems reviewed and negative except where noted in HPI.   Physical Exam:  BP 132/77 (BP Location: Left Arm, Patient Position: Sitting, Cuff Size: Normal)    Pulse 85    Temp 97.6 F (36.4 C) (Oral)    Ht 5' (1.524 m)    Wt 119 lb 4 oz (54.1 kg)    BMI 23.29 kg/m  No LMP recorded. Patient is postmenopausal.  General:   Alert,  Well-developed, well-nourished, pleasant and cooperative in NAD Head:  Normocephalic and atraumatic. Eyes:  Sclera clear, no icterus.   Conjunctiva pink. Ears:  Normal auditory acuity. Nose:  No deformity, discharge, or lesions. Mouth:  No deformity or lesions,oropharynx pink & moist. Neck:  Supple; no masses or thyromegaly. Lungs:  Respirations even and unlabored.  Clear throughout to auscultation.   No wheezes, crackles, or rhonchi. No acute distress. Heart:  Regular rate and rhythm; no murmurs, clicks, rubs, or gallops. Abdomen:  Normal bowel sounds. Soft, transverse scar in the upper abdomen, non-tender and non-distended without masses, hepatosplenomegaly or hernias  noted.  No guarding or rebound tenderness.   Rectal: Not performed Msk:  Symmetrical without gross deformities. Good, equal movement & strength bilaterally. Pulses:  Normal pulses noted. Extremities:  No clubbing or edema.  No cyanosis. Neurologic:  Alert and oriented x3;  grossly normal neurologically. Skin:  Intact without significant lesions or rashes. No jaundice. Psych:  Alert and cooperative. Normal mood and affect.  Imaging Studies: Reviewed  Assessment and Plan:   Rebekah Oliver is a 76 y.o. [place] female with history of hypertension is seen in consultation for 1 month history of epigastric burning pain associated with abdominal bloating and weight loss.  Upper endoscopy was unremarkable including duodenal and gastric biopsies.  Colonoscopy did not reveal any malignancy.  Recommend to increase omeprazole to 40 mg twice daily before meals.  Recommend gastric emptying study.  Try Zyprexa 2.5 mg at bedtime.  Discussed with patient daughter to encourage intake of protein supplements like boost or Ensure, small frequent meals   Follow up in 2 months   Arlyss Repress, MD

## 2021-04-13 NOTE — Telephone Encounter (Signed)
Pt was seen today and requesting medication for  appetite and also needing an understanding for alazopam

## 2021-04-14 ENCOUNTER — Encounter: Payer: Self-pay | Admitting: Gastroenterology

## 2021-04-20 ENCOUNTER — Encounter (INDEPENDENT_AMBULATORY_CARE_PROVIDER_SITE_OTHER): Payer: Self-pay | Admitting: Vascular Surgery

## 2021-04-21 ENCOUNTER — Telehealth: Payer: Self-pay | Admitting: Gastroenterology

## 2021-04-21 ENCOUNTER — Other Ambulatory Visit: Payer: Self-pay | Admitting: Gastroenterology

## 2021-04-21 DIAGNOSIS — G8929 Other chronic pain: Secondary | ICD-10-CM

## 2021-04-21 DIAGNOSIS — R1013 Epigastric pain: Secondary | ICD-10-CM

## 2021-04-21 MED ORDER — TRAMADOL HCL 50 MG PO TABS: 50.0000 mg | ORAL_TABLET | Freq: Four times a day (QID) | ORAL | 0 refills | Status: AC | PRN

## 2021-04-21 NOTE — Telephone Encounter (Signed)
Patient daughter verbalized understanding of instructions  

## 2021-04-21 NOTE — Telephone Encounter (Signed)
We will try short course of tramadol, sent in prescription for 50 mg every 6 hours as needed for pain, 30 pills only  RV

## 2021-04-21 NOTE — Telephone Encounter (Signed)
Pt daughter is requesting pain medication she states that her mom has a procedure on Friday . But needs something for severe abdominal pain before then.

## 2021-04-21 NOTE — Telephone Encounter (Signed)
Patient daughter states she is still having a lot of epigastric pain that is constant. Patient is still taking the omeprazole. She is not able to eat due to the pain or drink liquids. Patient has a gastric emptying study schedule for 04/24/2021

## 2021-04-22 ENCOUNTER — Other Ambulatory Visit: Payer: Self-pay | Admitting: Gastroenterology

## 2021-04-24 ENCOUNTER — Other Ambulatory Visit: Payer: Self-pay

## 2021-04-24 ENCOUNTER — Ambulatory Visit
Admission: RE | Admit: 2021-04-24 | Discharge: 2021-04-24 | Disposition: A | Discharge status: Home / Self Care | Payer: Medicare Other | Source: Ambulatory Visit | Attending: Gastroenterology | Admitting: Gastroenterology

## 2021-04-24 DIAGNOSIS — G8929 Other chronic pain: Secondary | ICD-10-CM | POA: Diagnosis present

## 2021-04-24 DIAGNOSIS — R1013 Epigastric pain: Secondary | ICD-10-CM | POA: Insufficient documentation

## 2021-04-24 MED ORDER — TECHNETIUM TC 99M SULFUR COLLOID
2.4000 | Freq: Once | INTRAVENOUS | Status: AC
  Administered 2021-04-24: 2.31 via ORAL

## 2021-04-27 ENCOUNTER — Telehealth: Payer: Self-pay

## 2021-04-27 MED ORDER — ALUM & MAG HYDROXIDE-SIMETH 200-200-20 MG/5ML PO SUSP
15.0000 mL | Freq: Four times a day (QID) | ORAL | 0 refills | Status: AC
Start: 2021-04-27 — End: 2021-05-11

## 2021-04-27 NOTE — Telephone Encounter (Signed)
Order GI cocktail  ?

## 2021-04-27 NOTE — Telephone Encounter (Signed)
Called and left a message for call back  

## 2021-04-27 NOTE — Telephone Encounter (Signed)
Patient daughter verbalized understanding. Sent medication to the pharmacy  ?

## 2021-04-27 NOTE — Telephone Encounter (Signed)
-----   Message from Lin Landsman, MD sent at 04/26/2021 10:08 PM EST ----- ?Please inform patient's daughter that her gastric emptying study came back normal.  Please send in prescription for GI cocktail for 2 weeks, recommend to take 10 to 15 mL every 6 hours as needed for abdominal pain ? ?RV ?

## 2021-05-01 ENCOUNTER — Telehealth: Payer: Self-pay

## 2021-05-01 DIAGNOSIS — R630 Anorexia: Secondary | ICD-10-CM

## 2021-05-01 DIAGNOSIS — G8929 Other chronic pain: Secondary | ICD-10-CM

## 2021-05-01 NOTE — Telephone Encounter (Signed)
Patient daughter is calling she states every time her mom takes the GI cocktail the abdominal pain gets worse and she has worse burning  ?

## 2021-05-02 NOTE — Telephone Encounter (Signed)
Please order H. pylori IgG ?Let's start her on aspirin 81 mg daily ?Recommend referral to vascular surgery to evaluate for chronic mesenteric ischemia ? ?RV ? ? ? ?

## 2021-05-04 NOTE — Addendum Note (Signed)
Addended by: Radene Knee L on: 05/04/2021 08:38 AM ? ? Modules accepted: Orders ? ?

## 2021-05-04 NOTE — Telephone Encounter (Signed)
Call can not be completed as dialed called other number and it is disconnected  ?

## 2021-05-04 NOTE — Telephone Encounter (Signed)
Tried to call both numbers and unable to make the call because the numbers are disconnected. Placed referral and order lab test  ?

## 2021-05-05 ENCOUNTER — Telehealth: Payer: Self-pay | Admitting: Gastroenterology

## 2021-05-05 NOTE — Telephone Encounter (Signed)
Patients daughter came in to the office to mention something about a gallbladder test for her mother and the number that is given is the best contacting number. [phone] ?

## 2021-05-05 NOTE — Telephone Encounter (Signed)
Informed patient of recommendation in other telephone call she verbalized understanding will come for labs tomorrow. She states that this Is her correct number now  ?

## 2021-05-08 LAB — H. PYLORI ANTIBODY, IGG: H. pylori, IgG AbS: 1.19 Index Value — ABNORMAL HIGH (ref 0.00–0.79)

## 2021-05-09 ENCOUNTER — Encounter: Payer: Self-pay | Admitting: Intensive Care

## 2021-05-09 ENCOUNTER — Other Ambulatory Visit: Payer: Self-pay

## 2021-05-09 ENCOUNTER — Emergency Department
Admission: EM | Admit: 2021-05-09 | Discharge: 2021-05-09 | Disposition: A | Discharge status: Home / Self Care | Payer: Medicare Other | Attending: Emergency Medicine | Admitting: Emergency Medicine

## 2021-05-09 DIAGNOSIS — R000 Tachycardia, unspecified: Secondary | ICD-10-CM | POA: Diagnosis not present

## 2021-05-09 DIAGNOSIS — I10 Essential (primary) hypertension: Secondary | ICD-10-CM | POA: Insufficient documentation

## 2021-05-09 DIAGNOSIS — R1012 Left upper quadrant pain: Secondary | ICD-10-CM | POA: Diagnosis not present

## 2021-05-09 DIAGNOSIS — R1011 Right upper quadrant pain: Secondary | ICD-10-CM | POA: Insufficient documentation

## 2021-05-09 DIAGNOSIS — R7989 Other specified abnormal findings of blood chemistry: Secondary | ICD-10-CM | POA: Insufficient documentation

## 2021-05-09 DIAGNOSIS — R1010 Upper abdominal pain, unspecified: Secondary | ICD-10-CM

## 2021-05-09 LAB — CBC WITH DIFFERENTIAL/PLATELET
Abs Immature Granulocytes: 0.03 10*3/uL (ref 0.00–0.07)
Basophils Absolute: 0 10*3/uL (ref 0.0–0.1)
Basophils Relative: 0 %
Eosinophils Absolute: 0 10*3/uL (ref 0.0–0.5)
Eosinophils Relative: 0 %
HCT: 40 % (ref 36.0–46.0)
Hemoglobin: 13.1 g/dL (ref 12.0–15.0)
Immature Granulocytes: 0 %
Lymphocytes Relative: 23 %
Lymphs Abs: 1.6 10*3/uL (ref 0.7–4.0)
MCH: 28.6 pg (ref 26.0–34.0)
MCHC: 32.8 g/dL (ref 30.0–36.0)
MCV: 87.3 fL (ref 80.0–100.0)
Monocytes Absolute: 0.5 10*3/uL (ref 0.1–1.0)
Monocytes Relative: 7 %
Neutro Abs: 4.9 10*3/uL (ref 1.7–7.7)
Neutrophils Relative %: 70 %
Platelets: 336 10*3/uL (ref 150–400)
RBC: 4.58 MIL/uL (ref 3.87–5.11)
RDW: 12.4 % (ref 11.5–15.5)
WBC: 7.1 10*3/uL (ref 4.0–10.5)
nRBC: 0 % (ref 0.0–0.2)

## 2021-05-09 LAB — BASIC METABOLIC PANEL
Anion gap: 12 (ref 5–15)
BUN: 23 mg/dL (ref 8–23)
CO2: 24 mmol/L (ref 22–32)
Calcium: 9.4 mg/dL (ref 8.9–10.3)
Chloride: 99 mmol/L (ref 98–111)
Creatinine, Ser: 1.21 mg/dL — ABNORMAL HIGH (ref 0.44–1.00)
GFR, Estimated: 47 mL/min — ABNORMAL LOW (ref >60)
Glucose, Bld: 106 mg/dL — ABNORMAL HIGH (ref 70–99)
Potassium: 3.9 mmol/L (ref 3.5–5.1)
Sodium: 135 mmol/L (ref 135–145)

## 2021-05-09 LAB — HEPATIC FUNCTION PANEL
ALT: 24 U/L (ref 0–44)
AST: 30 U/L (ref 15–41)
Albumin: 4.4 g/dL (ref 3.5–5.0)
Alkaline Phosphatase: 69 U/L (ref 38–126)
Bilirubin, Direct: 0.1 mg/dL (ref 0.0–0.2)
Indirect Bilirubin: 0.7 mg/dL (ref 0.3–0.9)
Total Bilirubin: 0.8 mg/dL (ref 0.3–1.2)
Total Protein: 8.1 g/dL (ref 6.5–8.1)

## 2021-05-09 LAB — TROPONIN I (HIGH SENSITIVITY): Troponin I (High Sensitivity): 7 ng/L (ref <18)

## 2021-05-09 LAB — LACTIC ACID, PLASMA: Lactic Acid, Venous: 1.7 mmol/L (ref 0.5–1.9)

## 2021-05-09 MED ORDER — METOCLOPRAMIDE HCL 10 MG PO TABS
10.0000 mg | ORAL_TABLET | Freq: Three times a day (TID) | ORAL | 0 refills | Status: AC | PRN
Start: 2021-05-09 — End: 2021-05-14

## 2021-05-09 MED ORDER — MORPHINE SULFATE (PF) 4 MG/ML IV SOLN
4.0000 mg | Freq: Once | INTRAVENOUS | Status: AC
  Administered 2021-05-09: 4 mg via INTRAVENOUS
  Filled 2021-05-09: qty 1

## 2021-05-09 MED ORDER — ONDANSETRON HCL 4 MG/2ML IJ SOLN
4.0000 mg | Freq: Once | INTRAMUSCULAR | Status: AC
  Administered 2021-05-09: 4 mg via INTRAVENOUS
  Filled 2021-05-09: qty 2

## 2021-05-09 MED ORDER — SODIUM CHLORIDE 0.9 % IV BOLUS
500.0000 mL | Freq: Once | INTRAVENOUS | Status: AC
  Administered 2021-05-09: 500 mL via INTRAVENOUS

## 2021-05-09 MED ORDER — HYDROCODONE-ACETAMINOPHEN 5-325 MG PO TABS: 1.0000 | ORAL_TABLET | Freq: Four times a day (QID) | ORAL | 0 refills | Status: AC | PRN

## 2021-05-09 NOTE — ED Notes (Signed)
Lavender, light green, blue, red top tubes sent to lab. ?

## 2021-05-09 NOTE — ED Notes (Signed)
Pt c/o epigastric pain/burning, especially after eating anything, taking meds, or drinking anything. Family is concerned for gallstones again. Pt no longer has a gallbladder. Pt does not want any more imaging, per daughters. Pt hesitant to have blood work done also. Per daughters, pt just wants something for pain. Per daughters, pt stopped taking all of her medications, including pain meds, approx 1 week ago. ?

## 2021-05-09 NOTE — ED Notes (Signed)
Wallace Cullens top tube on ice sent to lab. ?

## 2021-05-09 NOTE — ED Provider Notes (Signed)
? ?[facility] ?Provider Note ? ? ? Event Date/Time  ? First MD Initiated Contact with Patient 05/09/21 1504   ?  (approximate) ? ? ?History  ? ?Abdominal Pain ? ? ?HPI ? ?Rebekah Oliver is a 76 y.o. female with a PMH of hypertension who presents with worsening chronic abdominal pain.  She is present with both of her daughters who are providing interpretation with the patient's consent.  The patient has had bilateral upper abdominal pain for several months, mainly occurring after eating, and not really relieved by medication she has been on previously.  She has not had any vomiting or diarrhea, but has been constipated recently.  She has had multiple tests previously and has been evaluated by GI, but the patient and daughters state that the pain has worsened in the last few weeks and she is having constant pain today.  They state that she was on multiple medications, including tramadol for pain, but that none of them were helping and she stopped a week ago. ? ? ? ?Physical Exam  ? ?Triage Vital Signs: ?ED Triage Vitals  ?Enc Vitals Group  ?   BP 05/09/21 1501 117/90  ?   Pulse Rate 05/09/21 1501 (!) 120  ?   Resp 05/09/21 1501 18  ?   Temp 05/09/21 1501 98 ?F (36.7 ?C)  ?   Temp Source 05/09/21 1501 Oral  ?   SpO2 05/09/21 1501 97 %  ?   Weight 05/09/21 1505 135 lb (61.2 kg)  ?   Height 05/09/21 1505 5\' 2"  (1.575 m)  ?   Head Circumference --   ?   Peak Flow --   ?   Pain Score 05/09/21 1505 10  ?   Pain Loc --   ?   Pain Edu? --   ?   Excl. in GC? --   ? ? ?Most recent vital signs: ?Vitals:  ? 05/09/21 1501  ?BP: 117/90  ?Pulse: (!) 120  ?Resp: 18  ?Temp: 98 ?F (36.7 ?C)  ?SpO2: 97%  ? ? ? ?General: Alert, uncomfortable and anxious appearing but in no acute distress ?CV:  Good peripheral perfusion.  ?Resp:  Normal effort.  ?Abd:  Soft with mild bilateral upper quadrant tenderness.  No distention.  ?Other:  No scleral icterus or jaundice.  Dry mucous membranes. ? ? ?ED Results / Procedures /  Treatments  ? ?Labs ?(all labs ordered are listed, but only abnormal results are displayed) ?Labs Reviewed  ?BASIC METABOLIC PANEL - Abnormal; Notable for the following components:  ?    Result Value  ? Glucose, Bld 106 (*)   ? Creatinine, Ser 1.21 (*)   ? GFR, Estimated 47 (*)   ? All other components within normal limits  ?HEPATIC FUNCTION PANEL  ?CBC WITH DIFFERENTIAL/PLATELET  ?LACTIC ACID, PLASMA  ?URINALYSIS, ROUTINE W REFLEX MICROSCOPIC  ?TROPONIN I (HIGH SENSITIVITY)  ? ? ? ?EKG ? ? ? ?RADIOLOGY ? ? ? ?PROCEDURES: ? ?Critical Care performed: No ? ?Procedures ? ? ?MEDICATIONS ORDERED IN ED: ?Medications  ?ondansetron (ZOFRAN) injection 4 mg (4 mg Intravenous Given 05/09/21 1547)  ?morphine (PF) 4 MG/ML injection 4 mg (4 mg Intravenous Given 05/09/21 1547)  ?sodium chloride 0.9 % bolus 500 mL (0 mLs Intravenous Stopped 05/09/21 1647)  ? ? ? ?IMPRESSION / MDM / ASSESSMENT AND PLAN / ED COURSE  ?I reviewed the triage vital signs and the nursing notes. ? ?76 year old female with a history of hypertension presents with worsening  bilateral upper abdominal pain recently.  This pain has been chronic and she has been undergoing evaluation by GI with relatively extensive work-up in the last few months. ? ?I reviewed the past medical records.  The patient had a CT abdomen/pelvis on 12/10 which was negative for acute findings.  CT angiogram 8 days later showed aortoiliac atherosclerosis but no evidence of limited flow.  Colonoscopy on 1/17 was limited by the presence of stool but showed no acute findings, and upper endoscopy the same day showed a mild hiatal hernia but no other findings.  Gastric emptying study on 3/3 was also negative.  The patient was last seen by Dr. Allegra Lai from GI in 2/20 and at that time the patient was started on Zyprexa at bedtime.  Subsequently she was prescribed tramadol on 2/23.  The daughter states that none of this has helped. ? ?On exam, the patient is uncomfortable and anxious appearing.  She  was tachycardic at triage with otherwise normal vital signs.  The abdomen is soft with mild upper abdominal discomfort to palpation but no focal tenderness or peritoneal signs.  Exam is otherwise unremarkable. ? ?Of note, history and exam are somewhat limited by communication with the patient and daughters.  2 daughters are present in the ED today and have sometimes given conflicting information and opinions, initially not telling me that the patient had recently stopped all medications.  One daughter told me of her concern for possible retained gallstones but when I suggested lab work-up, the other daughter disagreed with any work-up and stated that the patient is just here for some pain medicine.  There was a similar discussion when I suggested possible repeat imaging, which the patient and daughters declined initially.  Unfortunately our video interpreter service does not have [place] as an option so I am reliant on the daughter's for interpretation. ? ?Overall I suspect that this is an exacerbation of what ever is causing the patient's chronic pain including possible gastritis, gastroparesis, chronic mesenteric ischemia, constipation, functional disorder such as IBS, or less likely ACS or other cardiac cause.  I do not suspect acute hepatobiliary pathology such as choledocholithiasis or cholangitis given that the patient is status post cholecystectomy several years ago and CT did not show any abnormal hepatobiliary findings. ? ?I attempted to communicate directly with the patient through her daughter directly interpreting what I said, and discussed further with the patient and her daughters.  I explained that given the patient's tachycardia, dry mucous membranes, and the recent worsening of the pain, that I would recommend lab work-up and fluids initially, and then we can reassess and determine if the patient requires any repeat imaging or further ED work-up.  They agree with this plan. ? ?We will obtain basic  labs, LFTs, lipase, lactate, troponin, urinalysis, EKG, give fluids, morphine, and Zofran, and reassess. ? ?----------------------------------------- ?5:38 PM on 05/09/2021 ?----------------------------------------- ? ?Lab work-up is very reassuring.  There is no leukocytosis, BMP shows minimally elevated creatinine consistent with mild dehydration, but the LFTs are normal.  Lactic acid and troponin are both normal.  The patient has not provided a urine sample.  It appears that an EKG was not performed.  The troponin is negative, the patient never had chest pain or palpitations, so at this point it is redundant and not necessary. ? ?On reassessment the patient appears very comfortable.  She denies any pain currently.  She is tolerating p.o.  I had an extensive discussion with her and the daughters.  Given the  reassuring work-up and the patient's well appearance, this appears to be an exacerbation of her chronic pain.  At this time I do not feel it is necessary to obtain a repeat CT or other emergent imaging.  There is no evidence of acute mesenteric ischemia, bowel obstruction, infectious etiology, or other process requiring imaging.  The patient and daughters are in agreement with this. ? ?Since the tramadol did not help, I will prescribe a small quantity of hydrocodone only to be used for more severe bouts of the pain.  I recommended that the patient start back on MiraLAX.  I will also prescribe some Reglan for nausea.  I advised the patient and daughters to follow-up with a gastroenterologist.  I gave the return precautions and they expressed understanding ? ? ? ?FINAL CLINICAL IMPRESSION(S) / ED DIAGNOSES  ? ?Final diagnoses:  ?Pain of upper abdomen  ? ? ? ?Rx / DC Orders  ? ?ED Discharge Orders   ? ?      Ordered  ?  HYDROcodone-acetaminophen (NORCO/VICODIN) 5-325 MG tablet  Every 6 hours PRN       ? 05/09/21 1737  ?  metoCLOPramide (REGLAN) 10 MG tablet  Every 8 hours PRN       ? 05/09/21 1737  ? ?  ?  ? ?   ? ? ? ?Note:  This document was prepared using Dragon voice recognition software and may include unintentional dictation errors.  ?  Dionne Bucy?Kendrick Haapala, MD ?05/09/21 1746 ? ?

## 2021-05-09 NOTE — ED Notes (Signed)
Pt & family member at bedside state they want to wait until the sister gets here to talk to the MD, as that sister knows more about what's going on with the pt. ?

## 2021-05-09 NOTE — Discharge Instructions (Addendum)
Follow-up with the gastroenterologist as scheduled.  You may give the hydrocodone for pain although only give it for strong pain.  It can cause constipation, so she should start back on the MiraLAX.  You may give the Reglan as needed for nausea. ? ?Return to the ER for new, worsening, or persistent severe pain, nausea or vomiting, fever, or any other new or worsening symptoms that concern you. ?

## 2021-05-09 NOTE — ED Notes (Signed)
Pt reports decreased pain at this time. ?

## 2021-05-09 NOTE — ED Triage Notes (Addendum)
Patient presents with abdominal pain/burning X4 months. Family reports she has seen family doctor with no relief. Reports hard BM yesterday and has been struggling with constipation. Patient reports she hasn't been eating because every time she does, the burning starts back. HX gallstones.  ?

## 2021-06-22 ENCOUNTER — Other Ambulatory Visit: Payer: Self-pay

## 2021-06-23 ENCOUNTER — Ambulatory Visit: Payer: Medicare Other | Admitting: Gastroenterology
